# Patient Record
Sex: Female | Born: 1965 | Race: White | Hispanic: No | Marital: Married | Smoking: Former smoker
Health system: Southern US, Community
[De-identification: ages and names within clinical notes are randomized; demographics above are authoritative.]

## PROBLEM LIST (undated history)

## (undated) DIAGNOSIS — E739 Lactose intolerance, unspecified: Secondary | ICD-10-CM

## (undated) DIAGNOSIS — D249 Benign neoplasm of unspecified breast: Secondary | ICD-10-CM

## (undated) DIAGNOSIS — E041 Nontoxic single thyroid nodule: Secondary | ICD-10-CM

## (undated) DIAGNOSIS — F419 Anxiety disorder, unspecified: Secondary | ICD-10-CM

## (undated) DIAGNOSIS — E7800 Pure hypercholesterolemia, unspecified: Secondary | ICD-10-CM

## (undated) DIAGNOSIS — T7840XA Allergy, unspecified, initial encounter: Secondary | ICD-10-CM

## (undated) DIAGNOSIS — K760 Fatty (change of) liver, not elsewhere classified: Secondary | ICD-10-CM

## (undated) DIAGNOSIS — D649 Anemia, unspecified: Secondary | ICD-10-CM

## (undated) DIAGNOSIS — E785 Hyperlipidemia, unspecified: Secondary | ICD-10-CM

## (undated) DIAGNOSIS — K589 Irritable bowel syndrome without diarrhea: Secondary | ICD-10-CM

## (undated) HISTORY — DX: Nontoxic single thyroid nodule: E04.1

## (undated) HISTORY — DX: Irritable bowel syndrome, unspecified: K58.9

## (undated) HISTORY — DX: Fatty (change of) liver, not elsewhere classified: K76.0

## (undated) HISTORY — DX: Anemia, unspecified: D64.9

## (undated) HISTORY — DX: Allergy, unspecified, initial encounter: T78.40XA

## (undated) HISTORY — DX: Hyperlipidemia, unspecified: E78.5

## (undated) HISTORY — DX: Lactose intolerance, unspecified: E73.9

## (undated) HISTORY — DX: Benign neoplasm of unspecified breast: D24.9

## (undated) HISTORY — DX: Pure hypercholesterolemia, unspecified: E78.00

## (undated) HISTORY — DX: Anxiety disorder, unspecified: F41.9

---

## 2001-08-10 ENCOUNTER — Ambulatory Visit (HOSPITAL_COMMUNITY): Admission: RE | Admit: 2001-08-10 | Discharge: 2001-08-10 | Payer: Self-pay | Admitting: *Deleted

## 2001-08-10 ENCOUNTER — Encounter: Payer: Self-pay | Admitting: *Deleted

## 2002-10-17 ENCOUNTER — Other Ambulatory Visit: Admission: RE | Admit: 2002-10-17 | Discharge: 2002-10-17 | Payer: Self-pay | Admitting: *Deleted

## 2002-10-21 ENCOUNTER — Encounter: Admission: RE | Admit: 2002-10-21 | Discharge: 2002-10-21 | Payer: Self-pay | Admitting: *Deleted

## 2002-10-21 ENCOUNTER — Encounter: Payer: Self-pay | Admitting: *Deleted

## 2003-02-17 ENCOUNTER — Ambulatory Visit (HOSPITAL_COMMUNITY): Admission: RE | Admit: 2003-02-17 | Discharge: 2003-02-17 | Payer: Self-pay | Admitting: Family Medicine

## 2003-03-11 ENCOUNTER — Emergency Department (HOSPITAL_COMMUNITY): Admission: EM | Admit: 2003-03-11 | Discharge: 2003-03-11 | Payer: Self-pay | Admitting: Emergency Medicine

## 2003-06-03 ENCOUNTER — Encounter: Admission: RE | Admit: 2003-06-03 | Discharge: 2003-06-03 | Payer: Self-pay | Admitting: *Deleted

## 2003-10-22 ENCOUNTER — Encounter: Admission: RE | Admit: 2003-10-22 | Discharge: 2003-10-22 | Payer: Self-pay | Admitting: *Deleted

## 2003-12-04 ENCOUNTER — Other Ambulatory Visit: Admission: RE | Admit: 2003-12-04 | Discharge: 2003-12-04 | Payer: Self-pay | Admitting: *Deleted

## 2004-11-04 ENCOUNTER — Encounter: Admission: RE | Admit: 2004-11-04 | Discharge: 2004-11-04 | Payer: Self-pay | Admitting: *Deleted

## 2005-01-31 ENCOUNTER — Other Ambulatory Visit: Admission: RE | Admit: 2005-01-31 | Discharge: 2005-01-31 | Payer: Self-pay | Admitting: Obstetrics & Gynecology

## 2005-11-28 ENCOUNTER — Encounter: Admission: RE | Admit: 2005-11-28 | Discharge: 2005-11-28 | Payer: Self-pay | Admitting: Obstetrics and Gynecology

## 2006-04-17 ENCOUNTER — Other Ambulatory Visit: Admission: RE | Admit: 2006-04-17 | Discharge: 2006-04-17 | Payer: Self-pay | Admitting: Obstetrics and Gynecology

## 2007-01-31 ENCOUNTER — Encounter: Admission: RE | Admit: 2007-01-31 | Discharge: 2007-01-31 | Payer: Self-pay | Admitting: Obstetrics and Gynecology

## 2007-04-25 ENCOUNTER — Other Ambulatory Visit: Admission: RE | Admit: 2007-04-25 | Discharge: 2007-04-25 | Payer: Self-pay | Admitting: Obstetrics & Gynecology

## 2008-04-14 ENCOUNTER — Encounter: Admission: RE | Admit: 2008-04-14 | Discharge: 2008-04-14 | Payer: Self-pay | Admitting: Gynecology

## 2008-04-28 ENCOUNTER — Other Ambulatory Visit: Admission: RE | Admit: 2008-04-28 | Discharge: 2008-04-28 | Payer: Self-pay | Admitting: Gynecology

## 2008-04-28 ENCOUNTER — Encounter: Payer: Self-pay | Admitting: Gynecology

## 2008-04-28 ENCOUNTER — Ambulatory Visit: Payer: Self-pay | Admitting: Gynecology

## 2008-11-14 ENCOUNTER — Encounter: Payer: Self-pay | Admitting: Gynecology

## 2008-11-14 ENCOUNTER — Ambulatory Visit: Payer: Self-pay | Admitting: Gynecology

## 2008-12-12 ENCOUNTER — Other Ambulatory Visit: Admission: RE | Admit: 2008-12-12 | Discharge: 2008-12-12 | Payer: Self-pay | Admitting: Gynecology

## 2008-12-12 ENCOUNTER — Ambulatory Visit: Payer: Self-pay | Admitting: Gynecology

## 2009-01-03 HISTORY — PX: BREAST BIOPSY: SHX20

## 2009-05-03 HISTORY — PX: BREAST FIBROADENOMA SURGERY: SHX580

## 2009-05-15 ENCOUNTER — Ambulatory Visit: Payer: Self-pay | Admitting: Gynecology

## 2009-05-15 ENCOUNTER — Other Ambulatory Visit: Admission: RE | Admit: 2009-05-15 | Discharge: 2009-05-15 | Payer: Self-pay | Admitting: Gynecology

## 2009-05-20 ENCOUNTER — Encounter: Admission: RE | Admit: 2009-05-20 | Discharge: 2009-05-20 | Payer: Self-pay | Admitting: Gynecology

## 2009-06-05 ENCOUNTER — Ambulatory Visit: Payer: Self-pay | Admitting: Gynecology

## 2009-11-04 ENCOUNTER — Ambulatory Visit: Payer: Self-pay | Admitting: Gynecology

## 2010-01-28 ENCOUNTER — Ambulatory Visit
Admission: RE | Admit: 2010-01-28 | Discharge: 2010-01-28 | Payer: Self-pay | Source: Home / Self Care | Attending: Gynecology | Admitting: Gynecology

## 2010-01-29 ENCOUNTER — Other Ambulatory Visit: Payer: Self-pay | Admitting: Gynecology

## 2010-01-29 DIAGNOSIS — N6310 Unspecified lump in the right breast, unspecified quadrant: Secondary | ICD-10-CM

## 2010-02-05 ENCOUNTER — Ambulatory Visit
Admission: RE | Admit: 2010-02-05 | Discharge: 2010-02-05 | Disposition: A | Discharge status: Home / Self Care | Payer: 59 | Source: Ambulatory Visit | Attending: Gynecology | Admitting: Gynecology

## 2010-02-05 DIAGNOSIS — N6310 Unspecified lump in the right breast, unspecified quadrant: Secondary | ICD-10-CM

## 2010-05-12 ENCOUNTER — Other Ambulatory Visit: Payer: Self-pay | Admitting: Gynecology

## 2010-05-12 DIAGNOSIS — Z1231 Encounter for screening mammogram for malignant neoplasm of breast: Secondary | ICD-10-CM

## 2010-05-25 ENCOUNTER — Ambulatory Visit
Admission: RE | Admit: 2010-05-25 | Discharge: 2010-05-25 | Disposition: A | Discharge status: Home / Self Care | Payer: 59 | Source: Ambulatory Visit | Attending: Gynecology | Admitting: Gynecology

## 2010-05-25 DIAGNOSIS — Z1231 Encounter for screening mammogram for malignant neoplasm of breast: Secondary | ICD-10-CM

## 2010-05-28 ENCOUNTER — Encounter: Payer: 59 | Admitting: Gynecology

## 2010-06-08 ENCOUNTER — Other Ambulatory Visit: Payer: Self-pay | Admitting: Gastroenterology

## 2010-06-23 ENCOUNTER — Encounter: Payer: 59 | Admitting: Gynecology

## 2010-07-06 ENCOUNTER — Encounter: Payer: 59 | Admitting: Gynecology

## 2010-07-20 ENCOUNTER — Other Ambulatory Visit: Payer: Self-pay | Admitting: Gynecology

## 2010-07-20 ENCOUNTER — Other Ambulatory Visit (HOSPITAL_COMMUNITY)
Admission: RE | Admit: 2010-07-20 | Discharge: 2010-07-20 | Disposition: A | Discharge status: Home / Self Care | Payer: 59 | Source: Ambulatory Visit | Attending: Gynecology | Admitting: Gynecology

## 2010-07-20 ENCOUNTER — Encounter (INDEPENDENT_AMBULATORY_CARE_PROVIDER_SITE_OTHER): Payer: 59 | Admitting: Gynecology

## 2010-07-20 DIAGNOSIS — Z01419 Encounter for gynecological examination (general) (routine) without abnormal findings: Secondary | ICD-10-CM

## 2010-07-20 DIAGNOSIS — Z124 Encounter for screening for malignant neoplasm of cervix: Secondary | ICD-10-CM | POA: Insufficient documentation

## 2010-11-30 ENCOUNTER — Encounter: Payer: Self-pay | Admitting: Gynecology

## 2010-11-30 ENCOUNTER — Ambulatory Visit (INDEPENDENT_AMBULATORY_CARE_PROVIDER_SITE_OTHER): Payer: 59 | Admitting: Gynecology

## 2010-11-30 VITALS — BP 110/70

## 2010-11-30 DIAGNOSIS — B3731 Acute candidiasis of vulva and vagina: Secondary | ICD-10-CM

## 2010-11-30 DIAGNOSIS — B373 Candidiasis of vulva and vagina: Secondary | ICD-10-CM

## 2010-11-30 DIAGNOSIS — K589 Irritable bowel syndrome without diarrhea: Secondary | ICD-10-CM | POA: Insufficient documentation

## 2010-11-30 DIAGNOSIS — L293 Anogenital pruritus, unspecified: Secondary | ICD-10-CM

## 2010-11-30 DIAGNOSIS — N898 Other specified noninflammatory disorders of vagina: Secondary | ICD-10-CM

## 2010-11-30 DIAGNOSIS — D249 Benign neoplasm of unspecified breast: Secondary | ICD-10-CM | POA: Insufficient documentation

## 2010-11-30 MED ORDER — FLUCONAZOLE 200 MG PO TABS: 200.0000 mg | ORAL_TABLET | Freq: Every day | ORAL | Status: AC

## 2010-11-30 NOTE — Progress Notes (Signed)
Patient presents complaining of a two-week history of vulvar pruritus and itching. She treated herself with OTC antifungal seem to transiently get better but then recur she got a second dose and has used it for several days and her symptoms seem to be getting worse.  Exam External with intense generalized vulvitis primarily labia majora bilaterally no specific lesions. Vagina with slight white discharge KOH wet prep done bimanual without masses or tenderness  Assessment and plan: wet prep is positive for yeast. We'll treat with Diflucan 200 mg daily for 5 days to eradicate cutaneous involvement. Patient will follow up if symptoms persist or recur

## 2010-11-30 NOTE — Patient Instructions (Signed)
Follow up if symptoms persist or recur.

## 2010-12-14 ENCOUNTER — Encounter: Payer: Self-pay | Admitting: Gynecology

## 2010-12-14 ENCOUNTER — Ambulatory Visit (INDEPENDENT_AMBULATORY_CARE_PROVIDER_SITE_OTHER): Payer: 59 | Admitting: Gynecology

## 2010-12-14 DIAGNOSIS — N899 Noninflammatory disorder of vagina, unspecified: Secondary | ICD-10-CM

## 2010-12-14 DIAGNOSIS — B373 Candidiasis of vulva and vagina: Secondary | ICD-10-CM

## 2010-12-14 DIAGNOSIS — N898 Other specified noninflammatory disorders of vagina: Secondary | ICD-10-CM

## 2010-12-14 DIAGNOSIS — B3731 Acute candidiasis of vulva and vagina: Secondary | ICD-10-CM

## 2010-12-14 DIAGNOSIS — R350 Frequency of micturition: Secondary | ICD-10-CM

## 2010-12-14 MED ORDER — FLUCONAZOLE 200 MG PO TABS: 200.0000 mg | ORAL_TABLET | Freq: Every day | ORAL | Status: AC

## 2010-12-14 MED ORDER — TERCONAZOLE 0.8 % VA CREA: 1.0000 | TOPICAL_CREAM | Freq: Every day | VAGINAL | Status: AC

## 2010-12-14 NOTE — Progress Notes (Signed)
Patient presents after having recently been treated for a yeast vulvovaginitis said seems to be better but still persistent itching and irritation.  Exam with chaperone present External with a mild generalized Folvite is no specific lesions. Vaginal exam shows white discharge cervix normal uterus normal size midline mobile nontender adnexa without masses or tenderness  Assessment and plan: Wet prep is positive for yeast. We'll treat with Terazol 3 cream internal/external and Diflucan 200 daily x7 days follow up if symptoms persist or recur. She recently had blood work to her primary have asked her to check to make sure they look of her white count and glucose.

## 2010-12-14 NOTE — Patient Instructions (Signed)
Use Diflucan pills daily x1 week and Terazol cream external and internal times several days.

## 2011-09-16 ENCOUNTER — Other Ambulatory Visit: Payer: Self-pay | Admitting: Gynecology

## 2011-09-16 DIAGNOSIS — Z1231 Encounter for screening mammogram for malignant neoplasm of breast: Secondary | ICD-10-CM

## 2011-09-23 ENCOUNTER — Ambulatory Visit (INDEPENDENT_AMBULATORY_CARE_PROVIDER_SITE_OTHER): Payer: 59 | Admitting: Women's Health

## 2011-09-23 ENCOUNTER — Encounter: Payer: Self-pay | Admitting: Women's Health

## 2011-09-23 DIAGNOSIS — N644 Mastodynia: Secondary | ICD-10-CM

## 2011-09-23 NOTE — Progress Notes (Signed)
Patient ID: Michele Manning, female   DOB: 1965/03/31, 46 y.o.   MRN: [id number] Presents with the complaint of discomfort/pain right inner breast for 4-5 days. States feels like a bruise without injury. History of fibroadenoma 1990. Right breast biopsy 2011 benign fibroadenoma. Denies wearing underwire bra. Normal mammogram 05/2010, annual mammogram scheduled in 11-02-2022. Maternal grandmother died at age 26 breast cancer. Minimal caffeine consumption.  Exam: No palpable nodules and sitting or lying position, no visible dimpling, ecchymosis, retractions or nipple discharge.  New onset mastodynia right breast inner aspect History of a fibroadenomas  Plan: Diagnostic right breast mammogram. Will schedule. 3-D screening mammogram reviewed, encouraged to have done at annual screen.

## 2011-09-26 ENCOUNTER — Telehealth: Payer: Self-pay | Admitting: *Deleted

## 2011-09-26 DIAGNOSIS — N644 Mastodynia: Secondary | ICD-10-CM

## 2011-09-26 NOTE — Telephone Encounter (Signed)
Message copied by Aura Camps on Mon Sep 26, 2011  1:18 PM ------      Message from: Ronald, [place] J      Created: Fri Sep 23, 2011  2:24 PM       Please schedule rt breast diagnostic mammogram. Pain in inner aspect for 5 d, history of fibroadenoma.  MGM breast cancer -  Died at 62. No palpable masses

## 2011-09-26 NOTE — Telephone Encounter (Signed)
Order placed for bil. Mammogram.

## 2011-09-27 NOTE — Telephone Encounter (Signed)
10/07/11 @ 10:20 am appointment.

## 2011-10-07 ENCOUNTER — Ambulatory Visit
Admission: RE | Admit: 2011-10-07 | Discharge: 2011-10-07 | Disposition: A | Discharge status: Home / Self Care | Payer: 59 | Source: Ambulatory Visit | Attending: Women's Health | Admitting: Women's Health

## 2011-10-07 DIAGNOSIS — N644 Mastodynia: Secondary | ICD-10-CM

## 2011-10-14 ENCOUNTER — Ambulatory Visit (INDEPENDENT_AMBULATORY_CARE_PROVIDER_SITE_OTHER): Payer: 59 | Admitting: Gynecology

## 2011-10-14 ENCOUNTER — Encounter: Payer: Self-pay | Admitting: Gynecology

## 2011-10-14 VITALS — BP 102/64 | Ht 66.75 in | Wt 161.0 lb

## 2011-10-14 DIAGNOSIS — Z131 Encounter for screening for diabetes mellitus: Secondary | ICD-10-CM

## 2011-10-14 DIAGNOSIS — Z13220 Encounter for screening for lipoid disorders: Secondary | ICD-10-CM

## 2011-10-14 DIAGNOSIS — Z01419 Encounter for gynecological examination (general) (routine) without abnormal findings: Secondary | ICD-10-CM

## 2011-10-14 LAB — CBC WITH DIFFERENTIAL/PLATELET
Hemoglobin: 13.8 g/dL (ref 12.0–15.0)
Lymphs Abs: 1.9 10*3/uL (ref 0.7–4.0)
Monocytes Relative: 7 % (ref 3–12)
Neutro Abs: 3.8 10*3/uL (ref 1.7–7.7)
Neutrophils Relative %: 60 % (ref 43–77)
RBC: 4.75 MIL/uL (ref 3.87–5.11)
WBC: 6.3 10*3/uL (ref 4.0–10.5)

## 2011-10-14 NOTE — Progress Notes (Signed)
Michele Manning [birth year] [id number]        46 y.o.  G3P3 for annual exam.  Doing well.  Past medical history,surgical history, medications, allergies, family history and social history were all reviewed and documented in the EPIC chart. ROS:  Was performed and pertinent positives and negatives are included in the history.  Exam: Michele Manning assistant Filed Vitals:   10/14/11 1125  BP: 102/64  Height: 5' 6.75" (1.695 m)  Weight: 161 lb (73.029 kg)   General appearance  Normal Skin grossly normal Head/Neck normal with no cervical or supraclavicular adenopathy thyroid normal Lungs  clear Cardiac RR, without RMG Abdominal  soft, nontender, without masses, organomegaly or hernia Breasts  examined lying and sitting without masses, retractions, discharge or axillary adenopathy. Pelvic  Ext/BUS/vagina  normal   Cervix  normal   Uterus  axial, normal size, shape and contour, midline and mobile nontender   Adnexa  Without masses or tenderness    Anus and perineum  normal   Rectovaginal  normal sphincter tone without palpated masses or tenderness.    Assessment/Plan:  46 y.o. G3P3 female for annual exam, regular menses, vasectomy birth control.   1. Intermittent right lower sternal chest discomfort. Patient notes mild discomfort right peripheral breast/lower sternal border. Comes and goes uncomfortable when pushed on. Recently had mammogram which was normal. Exam today is normal with some tenderness at the right costochondral border. I reviewed costochondritis and recommended heat/nonsteroidal anti-inflammatory. Continue with self exam. As long as no palpable abnormalities in this area results will follow. If it would be more consistent worsening or change she'll represent for further evaluation. 2. Mammography. Patient just had will continue with annual mammography and SBE monthly. 3. Pap smear. No Pap smear done today. Last Pap smear 2012. Patient has no history of abnormal Pap smears before. We'll  plan every 3-5 your screening. 4. Health maintenance. Baseline CBC lipid profile glucose urinalysis ordered. Follow up one year, sooner as needed.    Michele Lords MD, 11:51 AM 10/14/2011

## 2011-10-14 NOTE — Patient Instructions (Addendum)
Follow up in one year for annual exam. If right chest wall/breast discomfort becomes consistent, worsens or changes represent for further evaluation. Continue with annual mammography.

## 2011-10-15 LAB — URINALYSIS W MICROSCOPIC + REFLEX CULTURE
Bilirubin Urine: NEGATIVE
Casts: NONE SEEN
Glucose, UA: NEGATIVE mg/dL
Hgb urine dipstick: NEGATIVE
Protein, ur: NEGATIVE mg/dL
pH: 7 (ref 5.0–8.0)

## 2011-10-15 LAB — LIPID PANEL
Cholesterol: 211 mg/dL — ABNORMAL HIGH (ref 0–200)
HDL: 54 mg/dL (ref >39)
Total CHOL/HDL Ratio: 3.9 Ratio

## 2011-10-15 LAB — GLUCOSE, RANDOM: Glucose, Bld: 87 mg/dL (ref 70–99)

## 2011-10-16 LAB — URINE CULTURE: Colony Count: 55000

## 2011-10-21 ENCOUNTER — Ambulatory Visit: Payer: 59

## 2012-02-22 ENCOUNTER — Encounter: Payer: Self-pay | Admitting: Women's Health

## 2012-04-24 ENCOUNTER — Encounter: Payer: Self-pay | Admitting: Gynecology

## 2012-04-24 ENCOUNTER — Ambulatory Visit (INDEPENDENT_AMBULATORY_CARE_PROVIDER_SITE_OTHER): Payer: 59 | Admitting: Gynecology

## 2012-04-24 DIAGNOSIS — R1032 Left lower quadrant pain: Secondary | ICD-10-CM

## 2012-04-24 NOTE — Progress Notes (Signed)
Patient presents with a two-year history of left lower quadrant discomfort that comes and goes. Frequently triggered by food. Has bouts of diarrhea and abdominal cramping. Was evaluated by gastroenterology 2 years ago with a negative colonoscopy. Saw her primary who feels she has spastic colon and has started to treat her for this. She did some reading and was worried about ovarian disease such as ovarian cancer and just wanted to make sure before undergoing a more intensive GI treatment that everything was okay from a GYN standpoint. Regular menses, vasectomy birth control.  Exam was Michele Manning Abdomen soft nontender without masses guarding rebound organomegaly. Active bowel sounds throughout.  Pelvic external BUS vagina normal. Cervix normal. Uterus normal size midline mobile nontender. Adnexa without masses or tenderness.  Rectovaginal exam is normal.  Assessment and plan: Long history of left lower quadrant pain consistent with irritable bowel/spastic colon. Had colonoscopy 2 years ago historically. Recommended baseline ultrasound to rule out ovarian/uterine issues for her reassurance. Otherwise recommend that she followup with gastroenterology for more aggressive management.

## 2012-04-24 NOTE — Patient Instructions (Signed)
Follow up for ultrasound as scheduled 

## 2012-05-07 ENCOUNTER — Encounter: Payer: Self-pay | Admitting: Gastroenterology

## 2012-05-11 ENCOUNTER — Other Ambulatory Visit: Payer: 59

## 2012-05-11 ENCOUNTER — Ambulatory Visit: Payer: 59 | Admitting: Gynecology

## 2012-05-18 ENCOUNTER — Ambulatory Visit (INDEPENDENT_AMBULATORY_CARE_PROVIDER_SITE_OTHER): Payer: 59

## 2012-05-18 ENCOUNTER — Ambulatory Visit (INDEPENDENT_AMBULATORY_CARE_PROVIDER_SITE_OTHER): Payer: 59 | Admitting: Gynecology

## 2012-05-18 ENCOUNTER — Encounter: Payer: Self-pay | Admitting: Gynecology

## 2012-05-18 DIAGNOSIS — R1032 Left lower quadrant pain: Secondary | ICD-10-CM

## 2012-05-18 NOTE — Progress Notes (Signed)
Patient presents for ultrasound due to history of chronic left lower quadrant pain.  Ultrasound is normal noting uterus normal size and echotexture. Endometrial echo 6.7 mm. Right left ovary is visualized and normal. Cul-de-sac negative.  Assessment and plan: Chronic abdominal pain history of irritable bowel. Patient has appointment to see gastroenterology next week and will followup for this.

## 2012-05-18 NOTE — Patient Instructions (Signed)
Followup with gastroenterologist as you have scheduled.

## 2012-05-24 ENCOUNTER — Encounter: Payer: Self-pay | Admitting: Gastroenterology

## 2012-05-24 ENCOUNTER — Ambulatory Visit (INDEPENDENT_AMBULATORY_CARE_PROVIDER_SITE_OTHER): Payer: 59 | Admitting: Gastroenterology

## 2012-05-24 VITALS — BP 100/68 | HR 84 | Ht 66.75 in | Wt 161.6 lb

## 2012-05-24 DIAGNOSIS — R1032 Left lower quadrant pain: Secondary | ICD-10-CM

## 2012-05-24 DIAGNOSIS — R197 Diarrhea, unspecified: Secondary | ICD-10-CM

## 2012-05-24 DIAGNOSIS — G8929 Other chronic pain: Secondary | ICD-10-CM

## 2012-05-24 MED ORDER — HYOSCYAMINE SULFATE 0.125 MG SL SUBL: 0.1250 mg | SUBLINGUAL_TABLET | SUBLINGUAL | Status: DC | PRN

## 2012-05-24 NOTE — Progress Notes (Signed)
History of Present Illness:  This is a very pleasant 47 year old Caucasian female with 4 years of intermittent crampy left lower quadrant pain, loose diarrhea-type stools but no rectal bleeding.  In 2012 she had colonoscopy by Dr. Evette Cristal at [facility] which was apparently normal although I do not have that report.  Evaluation also was negative for celiac disease.  Recent labs are reviewed and all normal.  She continues with crampy left lower quadrant pain made better by bowel movement, and denies incontinency, nocturnal diarrhea, or any particular urgency.  She does have lactose intolerance, and avoids major dairy products.  The patient is tried probiotics, well Her meds, and they verified FOD-MAP some success, but continues to chew diet gum and breath meds.  Her appetite remains good and she has had no weight loss after an initial 20 pound weight loss in 2012.  There is no rectal bleeding, upper GI or hepatobiliary complaints.  Gynecologic evaluation by Dr. Audie Box has included normal ultrasound and pelvic exam.  The patient's diarrhea seems to be made worse with roughage, and is better with loperamide but this causes some resultant constipation.  She does have mild gas and bloating, this is not severe.  There is no associated skin rash, joint pains, mouth sores, or other systemic complaints.  The patient denies severe anxiety, depression, or other neuropsychiatric problems.  I have reviewed this patient's present history, medical and surgical past history, allergies and medications.     ROS:   All systems were reviewed and are negative unless otherwise stated in the HPI... history very cold hands but no true Raynaud's phenomenon.  No other symptoms of collagen vascular disease, cardiovascular pulmonary complaints.  No Known Allergies Outpatient Prescriptions Prior to Visit  Medication Sig Dispense Refill  . ALPRAZolam (XANAX) 0.25 MG tablet Take 0.25 mg by mouth at bedtime as needed for sleep.      .  Azelastine HCl (ASTEPRO NA) Place into the nose.        . fexofenadine-pseudoephedrine (ALLEGRA-D) 60-120 MG per tablet Take 1 tablet by mouth daily.       . Probiotic Product (PROBIOTIC PO) Take by mouth.       No facility-administered medications prior to visit.   Past Medical History  Diagnosis Date  . 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 IBS (irritable bowel syndrome)     LACTOSE INTOLERANT  . Lactose intolerance   . Hyperlipemia   . Anxiety    Past Surgical History  Procedure Laterality Date  . Breast fibroadenoma surgery  05/2009    FIBROADENOMA RIGHT BREAST   History   Social History  . Marital Status: Married    Spouse Name: N/A    Number of Children: 3  . 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 Smoking status: Never Smoker   . Smokeless tobacco: Never Used  . Alcohol Use: Yes     Comment: glass wine every day  . Drug Use: No  . Sexually Active: Yes -- Female partner(s)    Birth Control/ Protection: Other-see comments     Comment: vasectomy-husband   Other Topics Concern  . None   Social History Narrative  . None   Family History  Problem Relation Age of Onset  . Breast cancer Maternal Grandmother     early fifties  . COPD Father        Physical Exam:  At pressure 100/68, pulse 84 and regular and weight 161 with a BMI of 25.51. General well developed well nourished patient in no acute distress, appearing their stated age Eyes PERRLA, no icterus, fundoscopic exam per opthamologist Skin no lesions noted Neck supple, no adenopathy, no thyroid enlargement, no tenderness Chest clear to percussion and auscultation Heart no significant murmurs, gallops or rubs noted Abdomen no hepatosplenomegaly masses or tenderness, BS normal.  There is mild tenderness to deep palpation the left lower quadrant over a mobile sigmoid colon. Rectal inspection normal no fissures, or  fistulae noted.  No masses or tenderness on digital exam. Stool guaiac negative. Extremities no acute joint lesions, edema, phlebitis or evidence of cellulitis. Neurologic patient oriented x 3, cranial nerves intact, no focal neurologic deficits noted. Psychological mental status normal and normal affect.  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  I placed her on a low fructose, sorbitol diet, and of course she already avoids lactose products.  I will let her try when necessary sublingual Levsin 0.125 mg for left lower quadrant spasms.  Her insurance company has refused followup colonoscopy exam.  She may be a good candidate for Lotronex therapy depending on her clinical response to dietary adjustments and the above medications.  It is certainly possible this patient has underlying inflammatory bowel disease, and we may need to go ahead with repeat colonoscopy despite her insurance company's resistance.  We'll see her in several weeks' time for followup visit.  She has been given information concerning IBS and is management.  Please send a copy this to Dr. Audie Box at her primary care physician Dr. Mila Palmer.  No diagnosis found.

## 2012-05-24 NOTE — Patient Instructions (Addendum)
  We have sent the following medications to your pharmacy for you to pick up at your convenience: Levsin, please take as directed.  Information on Irritable Bowel Syndrome given today.  Information on Artifical Sweeteners given today.  Please make a follow up visit in 3 weeks with Dr. Jarold Motto. __________________________________________________________________________________________________________                                               We are excited to introduce MyChart, a new best-in-class service that provides you online access to important information in your electronic medical record. We want to make it easier for you to view your health information - all in one secure location - when and where you need it. We expect MyChart will enhance the quality of care and service we provide.  When you register for MyChart, you can:    View your test results.    Request appointments and receive appointment reminders via email.    Request medication renewals.    View your medical history, allergies, medications and immunizations.    Communicate with your physician's office through a password-protected site.    Conveniently print information such as your medication lists.  To find out if MyChart is right for you, please talk to a member of our clinical staff today. We will gladly answer your questions about this free health and wellness tool.  If you are age 45 or older and want a member of your family to have access to your record, you must provide written consent by completing a proxy form available at our office. Please speak to our clinical staff about guidelines regarding accounts for patients younger than age 45.  As you activate your MyChart account and need any technical assistance, please call the MyChart technical support line at [phone]-CHART 5090800523) or email your question to [email or web address]. If you email your question(s), please include your name, a  return phone number and the best time to reach you.  If you have non-urgent health-related questions, you can send a message to our office through MyChart at [email or web address]. If you have a medical emergency, call 911.  Thank you for using MyChart as your new health and wellness resource!   MyChart licensed from [employer],  [id number]. Patents Pending.

## 2012-05-24 NOTE — Addendum Note (Signed)
Addended by: Ok Anis A on: 05/24/2012 10:37 AM   Modules accepted: Orders

## 2012-05-30 ENCOUNTER — Encounter: Payer: Self-pay | Admitting: Family Medicine

## 2012-06-07 ENCOUNTER — Encounter: Payer: Self-pay | Admitting: Family Medicine

## 2012-06-15 ENCOUNTER — Ambulatory Visit (INDEPENDENT_AMBULATORY_CARE_PROVIDER_SITE_OTHER): Payer: 59 | Admitting: Gastroenterology

## 2012-06-15 ENCOUNTER — Encounter: Payer: Self-pay | Admitting: Gastroenterology

## 2012-06-15 VITALS — BP 98/60 | HR 100 | Ht 66.0 in | Wt 161.4 lb

## 2012-06-15 DIAGNOSIS — K589 Irritable bowel syndrome without diarrhea: Secondary | ICD-10-CM

## 2012-06-15 NOTE — Progress Notes (Addendum)
History of Present Illness: This is a 47 year old Caucasian female who has what appears to be IBS diarrhea.  She's better with dietary adjustments and when necessary Levsin use.  She denies  melena, hematochezia, anorexia, weight loss, systemic complaints et Karie Soda.  She's accompanied by her husband during the interview today.  I received her colonoscopy report from Dr. Evette Cristal, and she did have previous negative colonoscopy with multiple colon biopsy showed no evidence of microscopic colitis.  This was accomplished in June of 2012.     Current Medications, Allergies, Past Medical History, Past Surgical History, Family History and Social History were reviewed in [employer] record.  ROS: All systems were reviewed and are negative unless otherwise stated in the HPI.         Assessment and plan: IBS-type diarrhea.. have asked her to avoid nonabsorbable carbohydrates, and continues when necessary Levsin, and I do not think repeat colonoscopy indicated.  I will see her back again in 2 weeks' time for followup.  If her problems continue the future we need to consider Lotronex therapy.

## 2012-06-15 NOTE — Patient Instructions (Addendum)
Information on Fiber and Supplements given today for your review. Information on Artifical Sweeteners given today. Please start benefiber one tablespoon once daily. Please follow up with Dr.Patterson in two weeks. ____________________________________________________  High-Fiber Diet Fiber is found in fruits, vegetables, and grains. A high-fiber diet encourages the addition of more whole grains, legumes, fruits, and vegetables in your diet. The recommended amount of fiber for adult males is 38 g per day. For adult females, it is 25 g per day. Pregnant and lactating women should get 28 g of fiber per day. If you have a digestive or bowel problem, ask your caregiver for advice before adding high-fiber foods to your diet. Eat a variety of high-fiber foods instead of only a select few type of foods.  PURPOSE  To increase stool bulk.  To make bowel movements more regular to prevent constipation.  To lower cholesterol.  To prevent overeating. WHEN IS THIS DIET USED?  It may be used if you have constipation and hemorrhoids.  It may be used if you have uncomplicated diverticulosis (intestine condition) and irritable bowel syndrome.  It may be used if you need help with weight management.  It may be used if you want to add it to your diet as a protective measure against atherosclerosis, diabetes, and cancer. SOURCES OF FIBER  Whole-grain breads and cereals.  Fruits, such as apples, oranges, bananas, berries, prunes, and pears.  Vegetables, such as green peas, carrots, sweet potatoes, beets, broccoli, cabbage, spinach, and artichokes.  Legumes, such split peas, soy, lentils.  Almonds. FIBER CONTENT IN FOODS Starches and Grains / Dietary Fiber (g)  Cheerios, 1 cup / 3 g  Corn Flakes cereal, 1 cup / 0.7 g  Rice crispy treat cereal, 1 cup / 0.3 g  Instant oatmeal (cooked),  cup / 2 g  Frosted wheat cereal, 1 cup / 5.1 g  Brown, long-grain rice (cooked), 1 cup / 3.5 g  White,  long-grain rice (cooked), 1 cup / 0.6 g  Enriched macaroni (cooked), 1 cup / 2.5 g Legumes / Dietary Fiber (g)  Baked beans (canned, plain, or vegetarian),  cup / 5.2 g  Kidney beans (canned),  cup / 6.8 g  Pinto beans (cooked),  cup / 5.5 g Breads and Crackers / Dietary Fiber (g)  Plain or honey graham crackers, 2 squares / 0.7 g  Saltine crackers, 3 squares / 0.3 g  Plain, salted pretzels, 10 pieces / 1.8 g  Whole-wheat bread, 1 slice / 1.9 g  White bread, 1 slice / 0.7 g  Raisin bread, 1 slice / 1.2 g  Plain bagel, 3 oz / 2 g  Flour tortilla, 1 oz / 0.9 g  Corn tortilla, 1 small / 1.5 g  Hamburger or hotdog bun, 1 small / 0.9 g Fruits / Dietary Fiber (g)  Apple with skin, 1 medium / 4.4 g  Sweetened applesauce,  cup / 1.5 g  Banana,  medium / 1.5 g  Grapes, 10 grapes / 0.4 g  Orange, 1 small / 2.3 g  Raisin, 1.5 oz / 1.6 g  Melon, 1 cup / 1.4 g Vegetables / Dietary Fiber (g)  Green beans (canned),  cup / 1.3 g  Carrots (cooked),  cup / 2.3 g  Broccoli (cooked),  cup / 2.8 g  Peas (cooked),  cup / 4.4 g  Mashed potatoes,  cup / 1.6 g  Lettuce, 1 cup / 0.5 g  Corn (canned),  cup / 1.6 g  Tomato,  cup /  1.1 g Document Released: 12/20/2004 Document Revised: 06/21/2011 Document Reviewed: 03/24/2011 [facility] Patient Information 2014 [place], [place].                                                   We are excited to introduce MyChart, a new best-in-class service that provides you online access to important information in your electronic medical record. We want to make it easier for you to view your health information - all in one secure location - when and where you need it. We expect MyChart will enhance the quality of care and service we provide.  When you register for MyChart, you can:    View your test results.    Request appointments and receive appointment reminders via email.    Request medication renewals.    View your  medical history, allergies, medications and immunizations.    Communicate with your physician's office through a password-protected site.    Conveniently print information such as your medication lists.  To find out if MyChart is right for you, please talk to a member of our clinical staff today. We will gladly answer your questions about this free health and wellness tool.  If you are age 13 or older and want a member of your family to have access to your record, you must provide written consent by completing a proxy form available at our office. Please speak to our clinical staff about guidelines regarding accounts for patients younger than age 1.  As you activate your MyChart account and need any technical assistance, please call the MyChart technical support line at ([phone]-CHART [phone]) or email your question to [email or web address]. If you email your question(s), please include your name, a return phone number and the best time to reach you.  If you have non-urgent health-related questions, you can send a message to our office through MyChart at [email or web address]. If you have a medical emergency, call 911.  Thank you for using MyChart as your new health and wellness resource!   MyChart licensed from [employer],  [id number]. Patents Pending.

## 2012-06-25 ENCOUNTER — Encounter: Payer: Self-pay | Admitting: Gastroenterology

## 2012-07-03 ENCOUNTER — Encounter: Payer: Self-pay | Admitting: Gastroenterology

## 2012-07-03 ENCOUNTER — Ambulatory Visit (INDEPENDENT_AMBULATORY_CARE_PROVIDER_SITE_OTHER): Payer: 59 | Admitting: Gastroenterology

## 2012-07-03 VITALS — BP 106/72 | HR 80 | Ht 66.14 in | Wt 161.1 lb

## 2012-07-03 DIAGNOSIS — R197 Diarrhea, unspecified: Secondary | ICD-10-CM

## 2012-07-03 DIAGNOSIS — K589 Irritable bowel syndrome without diarrhea: Secondary | ICD-10-CM

## 2012-07-03 NOTE — Patient Instructions (Addendum)
Please call in two weeks and ask for Dr. Norval Gable nurse Aram Beecham to give an update on how you are feeling.  When you decide to try Lotronex just let us know.

## 2012-07-03 NOTE — Progress Notes (Signed)
History of Present Illness: This is a 47 year old Caucasian female with 2-3 years of intermittent rather severe urgent diarrhea.  She's tried elimination diet and is doing much better symptomatically but continues with lactose intolerance and occasional urgent diarrhea without melena or hematochezia.  Review of her chart shows recent negative upper abdominal ultrasound exam.  She's had some improvement with Levsin use.  Again, colonoscopy 2012 was unremarkable.  Patient relates that she took probable amoxicillin 2 weeks ago for a strep throat and had improvement in her GI complaints.    Current Medications, Allergies, Past Medical History, Past Surgical History, Family History and Social History were reviewed in [employer] record.  ROS: All systems were reviewed and are negative unless otherwise stated in the HPI.         Assessment and plan: Diarrhea predominant IBS.  I will let her try Lotronex 0.5 mg daily when necessary as needed while continuing her dietary restrictions.  I think it is unlikely that she has bacterial overgrowth syndrome, and she is on probiotic therapy already.  Reviewing information on Lotronex, the patient refused this medication and we will continue previous plans with her FOD-MAP diet for IBS with when necessary sublingual Levsin.  Please copy her primary care physician, referring physician, and pertinent subspecialists.

## 2012-09-07 ENCOUNTER — Other Ambulatory Visit: Payer: Self-pay | Admitting: Otolaryngology

## 2012-09-07 DIAGNOSIS — J3501 Chronic tonsillitis: Secondary | ICD-10-CM

## 2012-09-21 ENCOUNTER — Other Ambulatory Visit: Payer: Self-pay

## 2012-09-21 DIAGNOSIS — Z1231 Encounter for screening mammogram for malignant neoplasm of breast: Secondary | ICD-10-CM

## 2012-09-28 ENCOUNTER — Ambulatory Visit
Admission: RE | Admit: 2012-09-28 | Discharge: 2012-09-28 | Disposition: A | Discharge status: Home / Self Care | Payer: 59 | Source: Ambulatory Visit | Attending: Otolaryngology | Admitting: Otolaryngology

## 2012-09-28 DIAGNOSIS — J3501 Chronic tonsillitis: Secondary | ICD-10-CM

## 2012-09-28 MED ORDER — IOHEXOL 300 MG/ML  SOLN
75.0000 mL | Freq: Once | INTRAMUSCULAR | Status: AC | PRN
  Administered 2012-09-28: 75 mL via INTRAVENOUS

## 2012-10-19 ENCOUNTER — Encounter: Payer: Self-pay | Admitting: Gynecology

## 2012-10-19 ENCOUNTER — Telehealth: Payer: Self-pay | Admitting: *Deleted

## 2012-10-19 ENCOUNTER — Ambulatory Visit (INDEPENDENT_AMBULATORY_CARE_PROVIDER_SITE_OTHER): Payer: 59 | Admitting: Gynecology

## 2012-10-19 VITALS — BP 116/74 | Ht 67.0 in | Wt 163.0 lb

## 2012-10-19 DIAGNOSIS — Z01419 Encounter for gynecological examination (general) (routine) without abnormal findings: Secondary | ICD-10-CM

## 2012-10-19 DIAGNOSIS — Z13220 Encounter for screening for lipoid disorders: Secondary | ICD-10-CM

## 2012-10-19 DIAGNOSIS — N760 Acute vaginitis: Secondary | ICD-10-CM

## 2012-10-19 LAB — COMPREHENSIVE METABOLIC PANEL
ALT: 28 U/L (ref 0–35)
AST: 15 U/L (ref 0–37)
Chloride: 103 mEq/L (ref 96–112)
Creat: 0.87 mg/dL (ref 0.50–1.10)
Total Bilirubin: 0.5 mg/dL (ref 0.3–1.2)

## 2012-10-19 LAB — LIPID PANEL
Total CHOL/HDL Ratio: 3.4 Ratio
VLDL: 25 mg/dL (ref 0–40)

## 2012-10-19 LAB — CBC WITH DIFFERENTIAL/PLATELET
Basophils Absolute: 0 10*3/uL (ref 0.0–0.1)
Eosinophils Absolute: 0.2 10*3/uL (ref 0.0–0.7)
Eosinophils Relative: 2 % (ref 0–5)
Lymphocytes Relative: 24 % (ref 12–46)
MCV: 85.7 fL (ref 78.0–100.0)
Neutrophils Relative %: 66 % (ref 43–77)
Platelets: 261 10*3/uL (ref 150–400)
RDW: 14 % (ref 11.5–15.5)
WBC: 6.7 10*3/uL (ref 4.0–10.5)

## 2012-10-19 MED ORDER — NONFORMULARY OR COMPOUNDED ITEM: Status: DC

## 2012-10-19 NOTE — Progress Notes (Signed)
Michele Manning [birth year] [id number]        47 y.o.  G3P3 for annual exam.  Doing well.  Past medical history,surgical history, medications, allergies, family history and social history were all reviewed and documented in the EPIC chart.  ROS:  Performed and pertinent positives and negatives are included in the history, assessment and plan .  Exam: Kim assistant Filed Vitals:   10/19/12 1119  BP: 116/74  Height: 5\' 7"  (1.702 m)  Weight: 163 lb (73.936 kg)   General appearance  Normal Skin grossly normal Head/Neck normal with no cervical or supraclavicular adenopathy thyroid normal Lungs  clear Cardiac RR, without RMG Abdominal  soft, nontender, without masses, organomegaly or hernia Breasts  examined lying and sitting without masses, retractions, discharge or axillary adenopathy. Pelvic  Ext/BUS/vagina  normal  Cervix  normal   Uterus  anteverted, normal size, shape and contour, midline and mobile nontender   Adnexa  Without masses or tenderness    Anus and perineum  normal   Rectovaginal  normal sphincter tone without palpated masses or tenderness.    Assessment/Plan:  47 y.o. G3P3 female for annual exam, regular menses, vasectomy birth control.   1. Current yeast vaginitis. Patient does have an issue with recurrent yeast vaginitis and she uses OTC products to treat. Not having symptoms now.  Exam is normal. Discussed possible boric acid suppository trial 600 mg twice weekly #32 refills. Patient was to go ahead and do this and will call this into her pharmacy. Backing off to once weekly if successful reviewed. 2. Mammography. Patient has scheduled in November. SBE monthly reviewed. 3. Pap smear 2012. No Pap smear done today. No history of significant abnormal Pap smears. Plan repeat Pap smear next year at a 3 year interval. 4. Health maintenance. Was having abdominal pain earlier this year but this is all resolved with dietary changes. Baseline CBC comprehensive metabolic panel  lipid profile urinalysis ordered. Followup one year, sooner as needed.  Note: This document was prepared with digital dictation and possible smart phrase technology. Any transcriptional errors that result from this process are unintentional.   [name] MD, 11:58 AM 10/19/2012

## 2012-10-19 NOTE — Telephone Encounter (Signed)
Rx called in to [facility], left on pt voicemail rx called in.

## 2012-10-19 NOTE — Patient Instructions (Signed)
Try the boric acid vaginal suppositories twice weekly. Followup if any issues. Otherwise followup in one year for annual exam.

## 2012-10-19 NOTE — Telephone Encounter (Signed)
Message copied by Aura Camps on Fri Oct 19, 2012 12:04 PM ------      Message from: [name]      Created: Fri Oct 19, 2012 11:40 AM       Check with patients pharmacy.  I want Boric acid vaginal supp  600mg , #30 2 refills.  One per vagina 2 X weekly.  If they can't call in elsewhere and let patient know where. ------

## 2012-10-20 LAB — URINALYSIS W MICROSCOPIC + REFLEX CULTURE
Nitrite: NEGATIVE
Protein, ur: NEGATIVE mg/dL
Urobilinogen, UA: 0.2 mg/dL (ref 0.0–1.0)

## 2012-10-22 ENCOUNTER — Other Ambulatory Visit: Payer: Self-pay | Admitting: Gynecology

## 2012-10-22 ENCOUNTER — Other Ambulatory Visit: Payer: Self-pay | Admitting: *Deleted

## 2012-10-22 ENCOUNTER — Ambulatory Visit (INDEPENDENT_AMBULATORY_CARE_PROVIDER_SITE_OTHER): Payer: 59 | Admitting: Internal Medicine

## 2012-10-22 ENCOUNTER — Encounter: Payer: Self-pay | Admitting: Internal Medicine

## 2012-10-22 VITALS — BP 116/69 | HR 108 | Temp 98.1°F | Resp 18 | Ht 67.0 in | Wt 165.0 lb

## 2012-10-22 DIAGNOSIS — R946 Abnormal results of thyroid function studies: Secondary | ICD-10-CM | POA: Insufficient documentation

## 2012-10-22 DIAGNOSIS — E7800 Pure hypercholesterolemia, unspecified: Secondary | ICD-10-CM

## 2012-10-22 DIAGNOSIS — E785 Hyperlipidemia, unspecified: Secondary | ICD-10-CM

## 2012-10-22 DIAGNOSIS — J309 Allergic rhinitis, unspecified: Secondary | ICD-10-CM

## 2012-10-22 DIAGNOSIS — R7309 Other abnormal glucose: Secondary | ICD-10-CM

## 2012-10-22 DIAGNOSIS — G4700 Insomnia, unspecified: Secondary | ICD-10-CM

## 2012-10-22 DIAGNOSIS — N393 Stress incontinence (female) (male): Secondary | ICD-10-CM | POA: Insufficient documentation

## 2012-10-22 DIAGNOSIS — J358 Other chronic diseases of tonsils and adenoids: Secondary | ICD-10-CM | POA: Insufficient documentation

## 2012-10-22 DIAGNOSIS — Z1329 Encounter for screening for other suspected endocrine disorder: Secondary | ICD-10-CM

## 2012-10-22 LAB — URINE CULTURE: Colony Count: >=100000

## 2012-10-22 MED ORDER — ALPRAZOLAM 0.25 MG PO TABS: ORAL_TABLET | ORAL | Status: DC

## 2012-10-22 MED ORDER — SULFAMETHOXAZOLE-TMP DS 800-160 MG PO TABS: 1.0000 | ORAL_TABLET | Freq: Two times a day (BID) | ORAL | Status: DC

## 2012-10-22 MED ORDER — AZELASTINE HCL 0.15 % NA SOLN: NASAL | Status: DC

## 2012-10-23 ENCOUNTER — Other Ambulatory Visit: Payer: Self-pay | Admitting: *Deleted

## 2012-10-23 ENCOUNTER — Encounter: Payer: Self-pay | Admitting: *Deleted

## 2012-10-23 MED ORDER — AZELASTINE HCL 0.15 % NA SOLN: NASAL | Status: DC

## 2012-10-23 NOTE — Progress Notes (Signed)
Subjective:    Patient ID: Michele Manning, female    DOB: 14-Sep-1965, 47 y.o.   MRN: [id number]  HPI  [name] is here for first visit  Establish primary care  PMH of tonsillar cyst and thyroid gland calcification followed by ENT Dr. Jearld Fenton, breast fibroadenoma,  Anxiety  (Dr. Everlene Other therapist), chronic insomnia,  Hyperlipidemia,  Allergic rhinitis (well controlled with Astepro),  IBS  And stress incontinence  See CT scan of neck    Report of 3 mm calcification in R thyroid and slight lymph enlargement. Pt tells me that Dr. Jearld Fenton has ordered a follow up imaging in 2 months.  No FH of thyroid cancer no neck irradiation to pt.  Recent labs from Dr. Audie Box show minimal hyperlipidemia but I do not see a TSH   She has chronic insomnia that she uses an occasional Xanax for    She also tells me she has chronic vaginal yeast infections that she sees Dr. Audie Box for .  He has her on Boric acid suppositories now  No Known Allergies Past Medical History  Diagnosis Date  . 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 IBS (irritable bowel syndrome)     LACTOSE INTOLERANT  . Lactose intolerance   . Hyperlipemia   . Anxiety    Past Surgical History  Procedure Laterality Date  . Breast fibroadenoma surgery  05/2009    FIBROADENOMA RIGHT BREAST   History   Social History  . Marital Status: Married    Spouse Name: N/A    Number of Children: 3  . 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 Smoking status: Former Smoker    Quit date: 10/23/2003  . Smokeless tobacco: Never Used  . Alcohol Use: 7.0 oz/week    14 drink(s) per week     Comment: 2-3 glasses daily  . Drug Use: No  . Sexual Activity: Yes    Partners: Male    Birth Control/ Protection: Other-see comments, Surgical     Comment: vasectomy-husband   Other Topics Concern  . Not on file   Social History Narrative  . No narrative on file   Family  History  Problem Relation Age of Onset  . Breast cancer Maternal Grandmother     early fifties  . COPD Father    Patient Active Problem List   Diagnosis Date Noted  . Insomnia 10/22/2012  . Hyperlipidemia 10/22/2012  . Stress incontinence 10/22/2012  . Tonsillar cyst 10/22/2012  . Abnormal thyroid scan 10/22/2012  . Allergic rhinitis 10/22/2012  . Fibroadenoma of breast   . IBS (irritable bowel syndrome)    Current Outpatient Prescriptions on File Prior to Visit  Medication Sig Dispense Refill  . fexofenadine-pseudoephedrine (ALLEGRA-D) 60-120 MG per tablet Take 1 tablet by mouth daily.       . hyoscyamine (LEVSIN/SL) 0.125 MG SL tablet Place 1 tablet (0.125 mg total) under the tongue every 4 (four) hours as needed for cramping.  60 tablet  2  . NONFORMULARY OR COMPOUNDED ITEM Boric acid vaginal supp. 600 mg one per vagina twice weekly  30 each  2  . Probiotic Product (PROBIOTIC PO) Take by mouth.       No current facility-administered medications on file prior to visit.      Review of Systems    see HPI Objective:   Physical Exam Physical Exam  Nursing note and  vitals reviewed.  Constitutional: She is oriented to person, place, and time. She appears well-developed and well-nourished.  HENT:  Head: Normocephalic and atraumatic.  Cardiovascular: Normal rate and regular rhythm. Exam reveals no gallop and no friction rub.  No murmur heard.  Pulmonary/Chest: Breath sounds normal. She has no wheezes. She has no rales.  Neurological: She is alert and oriented to person, place, and time.  Skin: Skin is warm and dry.  Psychiatric: She has a normal mood and affect. Her behavior is normal.           Assessment & Plan:  Thyroid calcification  Will check TSH today.  I advised pt to be sure to get her follow up imaging as advised and ordered by Dr. Jearld Fenton.  She is not sure when test is scheduled.  I advised her that after her xray is done to schedule appt with me and we will go  over results as well as her ENT  Allergic rhinitis  OK to use AStepro on a temporary basis  Advised to come off in winter momths  Chronic insomnia/anxiety  :  OK for limited use of Xanax  . I encouraged use of Benadryl 50 mg in between.  Advised to take Xanax at a maximum of 3 times per week. I re-ordered # 12 for her  History of breast fibroadenoma  IBS  Tonsillar cyst  Followed by ent  Vaginal yeast infections per her report.  On boric acid per dR. Fontaine  Pt advised ot see me after her next thyroid imaging study

## 2012-10-23 NOTE — Patient Instructions (Signed)
Make appt with me after your next thyroid xray is complete  Get labs today

## 2012-10-29 ENCOUNTER — Telehealth: Payer: Self-pay | Admitting: *Deleted

## 2012-10-29 MED ORDER — CIPROFLOXACIN HCL 250 MG PO TABS: 250.0000 mg | ORAL_TABLET | Freq: Two times a day (BID) | ORAL | Status: DC

## 2012-10-29 NOTE — Telephone Encounter (Signed)
Pt informed, rx sent 

## 2012-10-29 NOTE — Telephone Encounter (Signed)
Pt took complete dose of Cipro x 3 days on 10/22/12, pt said she feels like infection still may be there. C/o some burning with urination, pt asked if refill could be given or different rx? Please advise

## 2012-10-29 NOTE — Telephone Encounter (Signed)
Ciprofloxacin 250 mg twice a day x7 days

## 2012-11-02 ENCOUNTER — Other Ambulatory Visit: Payer: 59

## 2012-11-02 DIAGNOSIS — E7800 Pure hypercholesterolemia, unspecified: Secondary | ICD-10-CM

## 2012-11-02 DIAGNOSIS — R7309 Other abnormal glucose: Secondary | ICD-10-CM

## 2012-11-02 LAB — LIPID PANEL
Cholesterol: 214 mg/dL — ABNORMAL HIGH (ref 0–200)
LDL Cholesterol: 132 mg/dL — ABNORMAL HIGH (ref 0–99)
Triglycerides: 100 mg/dL (ref <150)
VLDL: 20 mg/dL (ref 0–40)

## 2012-11-05 ENCOUNTER — Other Ambulatory Visit: Payer: Self-pay | Admitting: Gynecology

## 2012-11-05 DIAGNOSIS — R7309 Other abnormal glucose: Secondary | ICD-10-CM

## 2012-11-05 DIAGNOSIS — E7800 Pure hypercholesterolemia, unspecified: Secondary | ICD-10-CM

## 2012-11-06 ENCOUNTER — Encounter: Payer: Self-pay | Admitting: Women's Health

## 2012-11-06 ENCOUNTER — Ambulatory Visit (INDEPENDENT_AMBULATORY_CARE_PROVIDER_SITE_OTHER): Payer: 59 | Admitting: Women's Health

## 2012-11-06 DIAGNOSIS — R300 Dysuria: Secondary | ICD-10-CM

## 2012-11-06 DIAGNOSIS — N898 Other specified noninflammatory disorders of vagina: Secondary | ICD-10-CM

## 2012-11-06 DIAGNOSIS — N899 Noninflammatory disorder of vagina, unspecified: Secondary | ICD-10-CM

## 2012-11-06 LAB — URINALYSIS W MICROSCOPIC + REFLEX CULTURE
Bilirubin Urine: NEGATIVE
Glucose, UA: NEGATIVE mg/dL
Hgb urine dipstick: NEGATIVE
Protein, ur: NEGATIVE mg/dL
Specific Gravity, Urine: 1.01 (ref 1.005–1.030)
Urobilinogen, UA: 0.2 mg/dL (ref 0.0–1.0)

## 2012-11-06 LAB — WET PREP FOR TRICH, YEAST, CLUE
Clue Cells Wet Prep HPF POC: NONE SEEN
Trich, Wet Prep: NONE SEEN
Yeast Wet Prep HPF POC: NONE SEEN

## 2012-11-06 NOTE — Progress Notes (Signed)
Patient ID: Michele Manning, female   DOB: [birth year], 47 y.o.   MRN: [id number] Presents with complaint of vaginal irritation, continued urinary frequency with some urgency, pain resolved.Marland Kitchen UTI noted at annual exam with urine culture. Initially asymptomatic. Treated with Septra for 3 days, symptoms persisted Cipro 250 twice daily for 7 days symptoms are better but not 100%. Also was started on boric acid for chronic yeast, states feels like something is causing pressure inside vagina. Denies abdominal pain, fever, discharge with itching or odor. Monthly cycles/vasectomy.  Exam: Appears well. UA: Negative. Abdomen soft nontender, external genitalia minimal erythema noted, speculum exam, vaginal walls without erythema, clear to white discharge minimal wet prep negative. Bimanual no CMT or adnexal fullness or tenderness.  Negative test of cure UA,  Plan: Reviewed normality of exam. Urogesic blue samples with instructions to take up to 4 times a day for 2 days given. Instructed to call if no relief of symptoms. Will not use boric acid until symptoms resolve.

## 2012-11-16 ENCOUNTER — Ambulatory Visit
Admission: RE | Admit: 2012-11-16 | Discharge: 2012-11-16 | Disposition: A | Discharge status: Home / Self Care | Payer: 59 | Source: Ambulatory Visit

## 2012-11-16 DIAGNOSIS — Z1231 Encounter for screening mammogram for malignant neoplasm of breast: Secondary | ICD-10-CM

## 2012-12-20 ENCOUNTER — Other Ambulatory Visit: Payer: Self-pay | Admitting: Otolaryngology

## 2012-12-20 DIAGNOSIS — R591 Generalized enlarged lymph nodes: Secondary | ICD-10-CM

## 2012-12-22 ENCOUNTER — Encounter (HOSPITAL_COMMUNITY): Payer: Self-pay | Admitting: Emergency Medicine

## 2012-12-22 ENCOUNTER — Emergency Department (INDEPENDENT_AMBULATORY_CARE_PROVIDER_SITE_OTHER)
Admission: EM | Admit: 2012-12-22 | Discharge: 2012-12-22 | Disposition: A | Discharge status: Home / Self Care | Payer: 59 | Source: Home / Self Care | Attending: Family Medicine | Admitting: Family Medicine

## 2012-12-22 DIAGNOSIS — M5430 Sciatica, unspecified side: Secondary | ICD-10-CM

## 2012-12-22 DIAGNOSIS — M5432 Sciatica, left side: Secondary | ICD-10-CM

## 2012-12-22 MED ORDER — KETOROLAC TROMETHAMINE 60 MG/2ML IM SOLN
INTRAMUSCULAR | Status: AC
  Filled 2012-12-22: qty 2

## 2012-12-22 MED ORDER — METAXALONE 800 MG PO TABS: 800.0000 mg | ORAL_TABLET | Freq: Two times a day (BID) | ORAL | Status: DC | PRN

## 2012-12-22 MED ORDER — METHYLPREDNISOLONE 4 MG PO KIT: PACK | ORAL | Status: DC

## 2012-12-22 MED ORDER — KETOROLAC TROMETHAMINE 60 MG/2ML IM SOLN
60.0000 mg | Freq: Once | INTRAMUSCULAR | Status: AC
  Administered 2012-12-22: 60 mg via INTRAMUSCULAR

## 2012-12-22 NOTE — ED Provider Notes (Signed)
CSN: [id number]     Arrival date & time 12/22/12  4782 History   First MD Initiated Contact with Patient 12/22/12 1032     Chief Complaint  Patient presents with  . Back Pain   (Consider location/radiation/quality/duration/timing/severity/associated sxs/prior Treatment) Patient is a 47 y.o. female presenting with back pain. The history is provided by the patient.  Back Pain Location:  Sacro-iliac joint (+left) Quality:  Aching Radiates to: left buttock. Pain severity:  Moderate Pain is:  Same all the time Duration: began yesterday. Timing:  Constant Progression:  Unchanged Chronicity:  New Exacerbated by: exaserbated by transitions from sitting to standing and vice versa. Ineffective treatments:  Cold packs and heating pad Associated symptoms: no abdominal pain, no bladder incontinence, no bowel incontinence, no dysuria, no fever, no headaches, no leg pain, no numbness, no paresthesias, no pelvic pain, no perianal numbness, no tingling and no weakness   Risk factors comment:  Hx of sciatica in past   Past Medical History  Diagnosis Date  . 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 IBS (irritable bowel syndrome)     LACTOSE INTOLERANT  . Lactose intolerance   . Hyperlipemia   . Anxiety    Past Surgical History  Procedure Laterality Date  . Breast fibroadenoma surgery  05/2009    FIBROADENOMA RIGHT BREAST   Family History  Problem Relation Age of Onset  . Breast cancer Maternal Grandmother     early fifties  . COPD Father    History  Substance Use Topics  . Smoking status: Former Smoker    Quit date: 10/23/2003  . Smokeless tobacco: Never Used  . Alcohol Use: 7.0 oz/week    14 drink(s) per week     Comment: 2-3 glasses daily   OB History   Grav Para Term Preterm Abortions TAB SAB Ect Mult Living   3 3        3      Review of Systems  Constitutional: Negative for fever.  Gastrointestinal: Negative for abdominal pain and bowel incontinence.   Genitourinary: Negative for bladder incontinence, dysuria and pelvic pain.  Musculoskeletal: Positive for back pain.  Neurological: Negative for tingling, weakness, numbness, headaches and paresthesias.  All other systems reviewed and are negative.    Allergies  Review of patient's allergies indicates no known allergies.  Home Medications   Current Outpatient Rx  Name  Route  Sig  Dispense  Refill  . fexofenadine-pseudoephedrine (ALLEGRA-D) 60-120 MG per tablet   Oral   Take 1 tablet by mouth daily.          Marland Kitchen ALPRAZolam (XANAX) 0.25 MG tablet      Take one tablet 3 times a week prn sleep   12 tablet   0   . Azelastine HCl 0.15 % SOLN      One spray bid for 2 weeks then one spray daily for 2 months   30 mL   1   . hyoscyamine (LEVSIN/SL) 0.125 MG SL tablet   Sublingual   Place 1 tablet (0.125 mg total) under the tongue every 4 (four) hours as needed for cramping.   60 tablet   2   . metaxalone (SKELAXIN) 800 MG tablet   Oral   Take 1 tablet (800 mg total) by mouth 2 (two) times daily as needed for muscle spasms (or back pain).   14 tablet   0   . methylPREDNISolone (MEDROL DOSEPAK) 4 MG tablet  As directed   21 tablet   0   . NONFORMULARY OR COMPOUNDED ITEM      Boric acid vaginal supp. 600 mg one per vagina twice weekly   30 each   2   . Probiotic Product (PROBIOTIC PO)   Oral   Take by mouth.          BP 129/79  Pulse 97  Temp(Src) 98 F (36.7 C) (Oral)  Resp 16  SpO2 99%  LMP 12/20/2012 Physical Exam  Nursing note and vitals reviewed. Constitutional: She is oriented to person, place, and time. She appears well-developed and well-nourished. No distress.  HENT:  Head: Normocephalic and atraumatic.  Eyes: Conjunctivae are normal.  Neck: Neck supple.  Cardiovascular: Normal rate, regular rhythm and normal heart sounds.   Pulmonary/Chest: Effort normal and breath sounds normal.  Abdominal: Soft. Bowel sounds are normal. There is no  tenderness.  Musculoskeletal:       Legs: Neurological: She is alert and oriented to person, place, and time. She has normal reflexes. She exhibits normal muscle tone. Coordination and gait normal.  CSM exam of left lower extremity normal and intact  Skin: Skin is warm and dry. No rash noted.  Psychiatric: She has a normal mood and affect. Her behavior is normal.    ED Course  Procedures (including critical care time) Labs Review Labs Reviewed - No data to display Imaging Review No results found.  EKG Interpretation    Date/Time:    Ventricular Rate:    PR Interval:    QRS Duration:   QT Interval:    QTC Calculation:   R Axis:     Text Interpretation:              MDM   A) Sciatica left  P) Medrol dose pack, Skelaxin, F/u with PCP prn.  Discussed warning signs or symptoms. Please see discharge instructions. Patient expresses understanding.    Jess Barters [place], [place] 12/22/12 1128  Medical screening examination/treatment/procedure(s) were performed by a resident physician or non-physician practitioner and as the supervising physician I was immediately available for consultation/collaboration.  Clementeen Graham, MD   Rodolph Bong, MD 12/23/12 [phone]

## 2012-12-22 NOTE — ED Notes (Signed)
Pt is unable to provide Urine sample at this time.

## 2012-12-22 NOTE — ED Notes (Signed)
Pt c/o left lower back pain onset 2100 yest... Reports she had her legs propped up on the computer desk and when she got up to stand, she felt the pain Pain increases w/activity... Has tried heat/ice to the site Denies: f/v/n/d, urinary sxs She is alert w/no signs of acute distress.

## 2013-01-03 DIAGNOSIS — D249 Benign neoplasm of unspecified breast: Secondary | ICD-10-CM

## 2013-01-03 HISTORY — PX: BIOPSY THYROID: PRO38

## 2013-01-03 HISTORY — DX: Benign neoplasm of unspecified breast: D24.9

## 2013-01-18 ENCOUNTER — Ambulatory Visit
Admission: RE | Admit: 2013-01-18 | Discharge: 2013-01-18 | Disposition: A | Discharge status: Home / Self Care | Payer: 59 | Source: Ambulatory Visit | Attending: Otolaryngology | Admitting: Otolaryngology

## 2013-01-18 DIAGNOSIS — R591 Generalized enlarged lymph nodes: Secondary | ICD-10-CM

## 2013-02-04 ENCOUNTER — Other Ambulatory Visit: Payer: Self-pay | Admitting: Otolaryngology

## 2013-02-04 DIAGNOSIS — E041 Nontoxic single thyroid nodule: Secondary | ICD-10-CM

## 2013-02-13 ENCOUNTER — Ambulatory Visit: Payer: 59 | Admitting: Internal Medicine

## 2013-02-14 ENCOUNTER — Ambulatory Visit: Payer: 59 | Admitting: Internal Medicine

## 2013-02-18 ENCOUNTER — Ambulatory Visit: Payer: 59 | Admitting: Internal Medicine

## 2013-02-27 ENCOUNTER — Ambulatory Visit (INDEPENDENT_AMBULATORY_CARE_PROVIDER_SITE_OTHER): Payer: 59 | Admitting: Internal Medicine

## 2013-02-27 ENCOUNTER — Encounter: Payer: Self-pay | Admitting: Internal Medicine

## 2013-02-27 VITALS — BP 127/70 | HR 101 | Temp 98.3°F | Resp 18 | Wt 173.0 lb

## 2013-02-27 DIAGNOSIS — G4700 Insomnia, unspecified: Secondary | ICD-10-CM

## 2013-02-27 DIAGNOSIS — E041 Nontoxic single thyroid nodule: Secondary | ICD-10-CM

## 2013-02-27 DIAGNOSIS — J309 Allergic rhinitis, unspecified: Secondary | ICD-10-CM

## 2013-02-27 MED ORDER — ALPRAZOLAM 0.25 MG PO TABS: ORAL_TABLET | ORAL | Status: DC

## 2013-02-27 MED ORDER — AZELASTINE HCL 0.15 % NA SOLN: NASAL | Status: DC

## 2013-02-27 NOTE — Patient Instructions (Signed)
Refer to Dr. Cruzita Lederer  Thyroid nodule  Refer before her biopsy appointment  Set up CPE with me

## 2013-02-27 NOTE — Progress Notes (Signed)
Subjective:    Patient ID: Michele Manning, female    DOB: 1965/01/19, 48 y.o.   MRN: [id number]  HPI  Michele Manning is here for follow up  Nasal allergies flared   She needs more Asteline  Using occasional Xanax for insomnia    See recent thyroid ultrasound  1 cm  Nodule with calcification  .  She is asymptomatic  No XRT to head or neck.  Mother has graves.  Her ENT recommended biopsy and she would like to talk to endocrinologist before having biopsy  No Known Allergies Past Medical History  Diagnosis Date  . 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 IBS (irritable bowel syndrome)     LACTOSE INTOLERANT  . Lactose intolerance   . Hyperlipemia   . Anxiety   . Thyroid nodule    Past Surgical History  Procedure Laterality Date  . Breast fibroadenoma surgery  05/2009    FIBROADENOMA RIGHT BREAST   History   Social History  . Marital Status: Married    Spouse Name: N/A    Number of Children: 3  . 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 Smoking status: Former Smoker    Quit date: 10/23/2003  . Smokeless tobacco: Never Used  . Alcohol Use: 7.0 oz/week    14 drink(s) per week     Comment: 2-3 glasses daily  . Drug Use: No  . Sexual Activity: Yes    Partners: Male    Birth Control/ Protection: Other-see comments, Surgical     Comment: vasectomy-husband   Other Topics Concern  . Not on file   Social History Narrative  . No narrative on file   Family History  Problem Relation Age of Onset  . Breast cancer Maternal Grandmother     early fifties  . COPD Father    Patient Active Problem List   Diagnosis Date Noted  . Insomnia 10/22/2012  . Hyperlipidemia 10/22/2012  . Stress incontinence 10/22/2012  . Tonsillar cyst 10/22/2012  . Abnormal thyroid scan 10/22/2012  . Allergic rhinitis 10/22/2012  . Fibroadenoma of breast   . IBS (irritable bowel syndrome)    Current Outpatient  Prescriptions on File Prior to Visit  Medication Sig Dispense Refill  . ALPRAZolam (XANAX) 0.25 MG tablet Take one tablet 3 times a week prn sleep  12 tablet  0  . Azelastine HCl 0.15 % SOLN One spray bid for 2 weeks then one spray daily for 2 months  30 mL  1  . fexofenadine-pseudoephedrine (ALLEGRA-D) 60-120 MG per tablet Take 1 tablet by mouth daily.       . Probiotic Product (PROBIOTIC PO) Take by mouth.      . hyoscyamine (LEVSIN/SL) 0.125 MG SL tablet Place 1 tablet (0.125 mg total) under the tongue every 4 (four) hours as needed for cramping.  60 tablet  2   No current facility-administered medications on file prior to visit.      Review of Systems    see HPI Objective:   Physical Exam  Physical Exam  Nursing note and vitals reviewed.  Constitutional: She is oriented to person, place, and time. She appears well-developed and well-nourished.  HENT:  Head: Normocephalic and atraumatic.  Neck  No cervical adenopathy no discrete thyroid  nodule  Cardiovascular: Normal rate and regular rhythm. Exam reveals no gallop and no friction rub.  No murmur heard.  Pulmonary/Chest: Breath sounds normal. She has no wheezes. She has no rales.  Neurological: She is alert and oriented to person, place, and time.  Skin: Skin is warm and dry.  Psychiatric: She has a normal mood and affect. Her behavior is normal.        Assessment & Plan:  Thyroid  Nodule  Ok to get second opinion with endocrinologist  Will refer  Allergic rhinitis  Refill Astelize  Insomnnia  OK to refill Xanax #12    Schedule CPE

## 2013-03-12 ENCOUNTER — Other Ambulatory Visit: Payer: 59

## 2013-03-15 ENCOUNTER — Encounter: Payer: Self-pay | Admitting: Internal Medicine

## 2013-03-15 ENCOUNTER — Ambulatory Visit (INDEPENDENT_AMBULATORY_CARE_PROVIDER_SITE_OTHER): Payer: 59 | Admitting: Internal Medicine

## 2013-03-15 VITALS — BP 124/68 | HR 91 | Temp 98.3°F | Resp 12 | Ht 67.0 in | Wt 174.0 lb

## 2013-03-15 DIAGNOSIS — E042 Nontoxic multinodular goiter: Secondary | ICD-10-CM

## 2013-03-15 DIAGNOSIS — E041 Nontoxic single thyroid nodule: Secondary | ICD-10-CM

## 2013-03-15 NOTE — Progress Notes (Signed)
Patient ID: Michele Manning, female   DOB: 1965/12/03, 48 y.o.   MRN: [id number]   HPI  Michele Manning is a 48 y.o.-year-old female, referred by her PCP, Dr. Coralyn Mark, for evaluation for MNG.   She had a cyst on her tonsils >> investigated by a CT scan in 09/2012 >> incidentally found a nodule >> thyroid ultrasound (01/2013): several small nodules, largest lesion 10 x 9 x 9 cm with coarse calcification, mid lobe, corresponding to CT finding  I reviewed pt's thyroid tests: Lab Results  Component Value Date   TSH 2.330 10/22/2012    Pt denies feeling nodules in neck, hoarseness, dysphagia/odynophagia, SOB with lying down.  Pt denies: - heat intolerance/cold intolerance - tremors - palpitations - anxiety/depression - hyperdefecation/constipation - weight loss - weight gain - dry skin - hair falling - problems with concentration - fatigue  Pt does have a FH of thyroid dsves ds.) - in mother Berenice Primas Ds.). No FH of thyroid cancer. No h/o radiation tx to head or neck.  No seaweed or kelp, no recent contrast studies. No steroid use. No herbal supplements.   I reviewed her chart and she also has a history of HL, IBS - much better.   ROS: Constitutional: no weight gain/loss, no fatigue, no subjective hyperthermia/hypothermia Eyes: no blurry vision, no xerophthalmia ENT: no sore throat, no nodules palpated in throat, no dysphagia/odynophagia, no hoarseness Cardiovascular: no CP/SOB/palpitations/leg swelling Respiratory: no cough/SOB Gastrointestinal: no N/V/D/C Musculoskeletal: no muscle/joint aches Skin: no rashes Neurological: no tremors/numbness/tingling/dizziness Psychiatric: no depression/anxiety  Past Medical History  Diagnosis Date  . 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 IBS (irritable bowel syndrome)     LACTOSE INTOLERANT  . Lactose intolerance   . Hyperlipemia   . Anxiety   . Thyroid nodule    Past Surgical History  Procedure Laterality Date  .  Breast fibroadenoma surgery  05/2009    FIBROADENOMA RIGHT BREAST   History   Social History  . 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 Smoking status: Former Smoker    Quit date: 10/23/2003  . Smokeless tobacco: Never Used  . Alcohol Use: 7.0 oz/week    14 drink(s) per week     Comment: 2-3 glasses daily  . Drug Use: No  . Sexual Activity: Yes    Partners: Male    Birth Control/ Protection: Other-see comments, Surgical     Comment: vasectomy-husband   Current Outpatient Prescriptions on File Prior to Visit  Medication Sig Dispense Refill  . ALPRAZolam (XANAX) 0.25 MG tablet Take one tablet 3 times a week prn sleep  12 tablet  0  . Azelastine HCl 0.15 % SOLN One spray daily fo 4 weeks then stop  30 mL  0  . Cranberry 1000 MG CAPS Take 1 capsule by mouth daily.      . fexofenadine-pseudoephedrine (ALLEGRA-D) 60-120 MG per tablet Take 1 tablet by mouth daily.       . hyoscyamine (LEVSIN/SL) 0.125 MG SL tablet Place 1 tablet (0.125 mg total) under the tongue every 4 (four) hours as needed for cramping.  60 tablet  2  . OVER THE COUNTER MEDICATION 1 tablet daily. Yeast guard      . Probiotic Product (PROBIOTIC PO) Take by mouth.       No current facility-administered  medications on file prior to visit.   No Known Allergies Family History  Problem Relation Age of Onset  . Breast cancer Maternal Grandmother     early fifties  . COPD Father    PE: BP 124/68  Pulse 91  Temp(Src) 98.3 F (36.8 C) (Oral)  Resp 12  Ht 5\' 7"  (1.702 m)  Wt 174 lb (78.926 kg)  BMI 27.25 kg/m2  SpO2 98%  LMP 03/09/2013 Wt Readings from Last 3 Encounters:  03/15/13 174 lb (78.926 kg)  02/27/13 173 lb (78.472 kg)  10/22/12 165 lb (74.844 kg)   Constitutional: overweight, in NAD Eyes: PERRLA, EOMI, no exophthalmos ENT: moist mucous membranes, + thyromegaly, no cervical  lymphadenopathy Cardiovascular: RRR, No MRG Respiratory: CTA B Gastrointestinal: abdomen soft, NT, ND, BS+ Musculoskeletal: no deformities, strength intact in all 4;  Skin: moist, warm, no rashes Neurological: no tremor with outstretched hands, DTR normal in all 4  ASSESSMENT: 1. MNG - thyroid U/S (01/2013):   Right thyroid lobe: 47 x 14 x 15 mm. Fairly homogeneous background echotexture. Several small nodules, largest lesion 10 x 9 x 9 cm with coarse calcification, mid lobe, corresponding to CT finding. Others are all less than 4 mm.   Left thyroid lobe: 45 x 12 x 13 mm. Fairly homogeneous background echotexture. 7 x 5 x 11 mm hypoechoic solid nodule, inferior pole. Adjacent 4.6 mm hypoechoic/ cystic lesion.   Isthmus : 2.2 mm. No nodules visualized.   Lymphadenopathy: None visualized.  PLAN: 1. MNG  - I reviewed the images of her thyroid ultrasound along with the patient and her husband. I pointed out that the dominant nodule is not large large but is hypoechoic, with a large calcification in the center, without internal blood flow, more wide than tall, but not well delimited from surrounding tissue. Pt does not have a thyroid cancer family history or a personal history of RxTx to head/neck.  - the only way that we can tell exactly if it is cancer or not is by doing a thyroid biopsy (FNA). I explained what the test entails. I did explain that her nodule is smaller then the threshold for Bx (1.5 cm), but it has some concerning features (calcification, irregular margins) so I think she will benefit from the FNA - We discussed about other options, to wait for another 12 months to a year and see if the nodule grows, and only intervene at that time, but she opted not to do this  - I explained that this is not cancer, we can continue to follow her on a yearly basis, and check another ultrasound in another year or 2. - patient decided to have the FNA done now >> I ordered this.  - I'll see her  back in a year, assuming her FNA is normal. If FNA abnormal, we will meet sooner.  - I advised pt to join my chart and I will send her the results through there   03/28/2013 FNA: Adequacy Reason Satisfactory For Evaluation. Diagnosis THYROID, FINE NEEDLE ASPIRATION, RIGHT (SPECIMEN 1 OF 1, COLLECTED ON 03/27/13): BENIGN (BETHESDA CLASS II). FINDINGS CONSISTENT WITH BENIGN FOLLICULAR NODULE. Claudette Laws MD Pathologist, Electronic Signature (Case signed 03/28/2013)  I will let pt know.

## 2013-03-15 NOTE — Patient Instructions (Signed)
Please return in 1 year. You will be called about the nodule biopsy.  Please join MyChart >> I will send you the biopsy results through there.

## 2013-03-27 ENCOUNTER — Ambulatory Visit
Admission: RE | Admit: 2013-03-27 | Discharge: 2013-03-27 | Disposition: A | Discharge status: Home / Self Care | Payer: 59 | Source: Ambulatory Visit | Attending: Internal Medicine | Admitting: Internal Medicine

## 2013-03-27 ENCOUNTER — Other Ambulatory Visit (HOSPITAL_COMMUNITY)
Admission: RE | Admit: 2013-03-27 | Discharge: 2013-03-27 | Disposition: A | Discharge status: Home / Self Care | Payer: 59 | Source: Ambulatory Visit | Attending: Interventional Radiology | Admitting: Interventional Radiology

## 2013-03-27 DIAGNOSIS — E041 Nontoxic single thyroid nodule: Secondary | ICD-10-CM | POA: Insufficient documentation

## 2013-03-28 DIAGNOSIS — E042 Nontoxic multinodular goiter: Secondary | ICD-10-CM | POA: Insufficient documentation

## 2013-05-12 ENCOUNTER — Emergency Department (INDEPENDENT_AMBULATORY_CARE_PROVIDER_SITE_OTHER)
Admission: EM | Admit: 2013-05-12 | Discharge: 2013-05-12 | Disposition: A | Discharge status: Home / Self Care | Payer: 59 | Source: Home / Self Care | Attending: Emergency Medicine | Admitting: Emergency Medicine

## 2013-05-12 ENCOUNTER — Encounter (HOSPITAL_COMMUNITY): Payer: Self-pay | Admitting: Emergency Medicine

## 2013-05-12 DIAGNOSIS — J039 Acute tonsillitis, unspecified: Secondary | ICD-10-CM

## 2013-05-12 LAB — POCT RAPID STREP A: Streptococcus, Group A Screen (Direct): NEGATIVE

## 2013-05-12 MED ORDER — AMOXICILLIN 500 MG PO CAPS: 500.0000 mg | ORAL_CAPSULE | Freq: Three times a day (TID) | ORAL | Status: DC

## 2013-05-12 MED ORDER — PREDNISONE 20 MG PO TABS: 20.0000 mg | ORAL_TABLET | Freq: Two times a day (BID) | ORAL | Status: DC

## 2013-05-12 NOTE — Discharge Instructions (Signed)

## 2013-05-12 NOTE — ED Notes (Signed)
Patient complains of sore throat with low grade fever at night; denies nausea and vomiting; states she has cysts on tonsils.

## 2013-05-12 NOTE — ED Provider Notes (Signed)
Chief Complaint   Chief Complaint  Patient presents with  . 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 She denies any GI symptoms. She's had no known exposure to strep. She has had strep in the past but has been years ago. She has a large cyst on her right tonsil.   Review of Systems   Other than as noted above, the patient denies any of the following symptoms. Systemic:  No fever, chills, sweats, myalgias, or headache. Eye:  No redness, pain or drainage. ENT:  No earache, nasal congestion, sneezing, rhinorrhea, sinus pressure, sinus pain, or post nasal drip. Lungs:  No cough, sputum production, wheezing, shortness of breath, or chest pain. GI:  No abdominal pain, nausea, vomiting, or diarrhea. Skin:  No rash.  [place]   Past medical history, family history, social history, meds, and allergies were reviewed.   Physical Exam     Vital signs:  BP 107/84  Pulse 103  Temp(Src) 98.6 F (37 C) (Oral)  Resp 19  SpO2 100%  LMP 05/03/2013 General:  Alert, in no distress. Phonation was normal, no drooling, and patient was able to handle secretions well.  Eye:  No conjunctival injection or drainage. Lids were normal. ENT:  TMs and canals were normal, without erythema or inflammation.  Nasal mucosa was clear and uncongested, without drainage.  Mucous membranes were moist.  Exam of pharynx reveals both tonsils to be enlarged, red, and covered with exudate.  There were no oral ulcerations or lesions. There was no bulging of the tonsillar pillars, and the uvula was midline. Neck:  Supple, she has bilateral, tender anterior cervical lymphadenopathy. Lungs:  No respiratory distress.  Lungs were clear to auscultation, without wheezes, rales or rhonchi.  Breath sounds  were clear and equal bilaterally.  Heart:  Regular rhythm, without gallops, murmers or rubs. Skin:  Clear, warm, and dry, without rash or lesions.  Labs   Results for orders placed during the hospital encounter of 05/12/13  POCT RAPID STREP A ([facility] ONLY)      Result Value Ref Range   Streptococcus, Group A Screen (Direct) NEGATIVE  NEGATIVE   Assessment   The encounter diagnosis was Tonsillitis.  There is no evidence of a peritonsillar abscess, retropharyngeal abscess, or epiglottitis.  Centor score is 3, therefore will treat with antibiotics.  Plan     1.  Meds:  The following meds were prescribed:   Discharge Medication List as of 05/12/2013  9:55 AM    START taking these medications   Details  amoxicillin (AMOXIL) 500 MG capsule Take 1 capsule (500 mg total) by mouth 3 (three) times daily., Starting 05/12/2013, Until Discontinued, Normal    predniSONE (DELTASONE) 20 MG tablet Take 1 tablet (20 mg total) by mouth 2 (two) times daily., Starting 05/12/2013, Until Discontinued, Normal        2.  Patient Education/Counseling:  The patient was given appropriate handouts, self care instructions, and instructed in symptomatic relief, including hot saline gargles, throat lozenges, infectious precautions, and need to trade out toothbrush.    3.  Follow up:  The patient was told to follow up here if no better in 3 to 4 days, or sooner if becoming worse in any way, and given some  red flag symptoms such as difficulty swallowing or breathing which would prompt immediate return.      Harden Mo, MD 05/12/13 [phone]

## 2013-05-14 ENCOUNTER — Other Ambulatory Visit: Payer: Self-pay | Admitting: *Deleted

## 2013-05-14 LAB — CULTURE, GROUP A STREP

## 2013-05-14 NOTE — Telephone Encounter (Signed)
Refill request

## 2013-05-15 MED ORDER — AZELASTINE HCL 0.15 % NA SOLN: NASAL | Status: DC

## 2013-05-30 ENCOUNTER — Emergency Department (INDEPENDENT_AMBULATORY_CARE_PROVIDER_SITE_OTHER)
Admission: EM | Admit: 2013-05-30 | Discharge: 2013-05-30 | Disposition: A | Discharge status: Home / Self Care | Payer: 59 | Source: Home / Self Care | Attending: Emergency Medicine | Admitting: Emergency Medicine

## 2013-05-30 ENCOUNTER — Encounter (HOSPITAL_COMMUNITY): Payer: Self-pay | Admitting: Emergency Medicine

## 2013-05-30 DIAGNOSIS — J45909 Unspecified asthma, uncomplicated: Secondary | ICD-10-CM

## 2013-05-30 MED ORDER — ALBUTEROL SULFATE HFA 108 (90 BASE) MCG/ACT IN AERS: 2.0000 | INHALATION_SPRAY | Freq: Four times a day (QID) | RESPIRATORY_TRACT | Status: DC

## 2013-05-30 MED ORDER — HYDROCOD POLST-CHLORPHEN POLST 10-8 MG/5ML PO LQCR: 5.0000 mL | Freq: Two times a day (BID) | ORAL | Status: DC | PRN

## 2013-05-30 MED ORDER — PREDNISONE 20 MG PO TABS: ORAL_TABLET | ORAL | Status: DC

## 2013-05-30 NOTE — ED Provider Notes (Signed)
  Chief Complaint   Chief Complaint  Patient presents with  . 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 She finished up her antibiotics and felt well for a few days up until 5 days ago when she developed nasal congestion with yellow drainage, headache, dry cough, chest tightness, wheezing, and difficulty breathing. She has no history of asthma or cigarette use. Her chest feels raw but does not hurt. She's had a scratchy throat and loss of appetite. She denies any fever, chills, or other GI symptoms.  Review of Systems   Other than as noted above, the patient denies any of the following symptoms: Systemic:  No fevers, chills, sweats, or myalgias. Eye:  No redness or discharge. ENT:  No ear pain, headache, nasal congestion, drainage, sinus pressure, or sore throat. Neck:  No neck pain, stiffness, or swollen glands. Lungs:  No cough, sputum production, hemoptysis, wheezing, chest tightness, shortness of breath or chest pain. GI:  No abdominal pain, nausea, vomiting or diarrhea.  [place]   Past medical history, family history, social history, meds, and allergies were reviewed.   Physical exam   Vital signs:  BP 123/64  Pulse 96  Temp(Src) 97.8 F (36.6 C) (Oral)  Resp 16  SpO2 100%  LMP 05/03/2013 General:  Alert and oriented.  In no distress.  Skin warm and dry. Eye:  No conjunctival injection or drainage. Lids were normal. ENT:  TMs and canals were normal, without erythema or inflammation.  Nasal mucosa was clear and uncongested, without drainage.  Mucous membranes were moist.  Pharynx was clear with no exudate or drainage.  There were no oral ulcerations or lesions. Neck:  Supple, no adenopathy, tenderness or mass. Lungs:  No respiratory distress.  She has minimal scattered wheezes bilaterally with good air movement, and no rales or rhonchi.  Heart:  Regular rhythm,  without gallops, murmers or rubs. Skin:  Clear, warm, and dry, without rash or lesions.  Assessment     The encounter diagnosis was Asthmatic bronchitis.  Plan    1.  Meds:  The following meds were prescribed:   New Prescriptions   ALBUTEROL (PROVENTIL HFA;VENTOLIN HFA) 108 (90 BASE) MCG/ACT INHALER    Inhale 2 puffs into the lungs 4 (four) times daily.   CHLORPHENIRAMINE-HYDROCODONE (TUSSIONEX) 10-8 MG/5ML LQCR    Take 5 mLs by mouth every 12 (twelve) hours as needed for cough.   PREDNISONE (DELTASONE) 20 MG TABLET    Take 3 daily for 4 days, 2 daily for 4 days, 1 daily for 4 days.    2.  Patient Education/Counseling:  The patient was given appropriate handouts, self care instructions, and instructed in symptomatic relief.  Instructed to get extra fluids, rest, and use a cool mist vaporizer.    3.  Follow up:  The patient was told to follow up here if no better in 3 to 4 days, or sooner if becoming worse in any way, and given some red flag symptoms such as increasing fever, difficulty breathing, chest pain, or persistent vomiting which would prompt immediate return.  Follow up here as needed.      Harden Mo, MD 05/30/13 [phone]

## 2013-05-30 NOTE — ED Notes (Signed)
C/o cough States she feels as if she has a sinus infection She received antibiotics from [facility] on May 10 States she has sinus drainage, coughing which makes her chest feels dry denies any vomiting and diarrhea

## 2013-05-30 NOTE — Discharge Instructions (Signed)
How to Use an Inhaler Proper inhaler technique is very important. Good technique ensures that the medicine reaches the lungs. Poor technique results in depositing the medicine on the tongue and back of the throat rather than in the airways. If you do not use the inhaler with good technique, the medicine will not help you. STEPS TO FOLLOW IF USING AN INHALER WITHOUT AN EXTENSION TUBE 1. Remove the cap from the inhaler. 2. If you are using the inhaler for the first time, you will need to prime it. Shake the inhaler for 5 seconds and release four puffs into the air, away from your face. Ask your health care provider or pharmacist if you have questions about priming your inhaler. 3. Shake the inhaler for 5 seconds before each breath in (inhalation). 4. Position the inhaler so that the top of the canister faces up. 5. Put your index finger on the top of the medicine canister. Your thumb supports the bottom of the inhaler. 6. Open your mouth. 7. Either place the inhaler between your teeth and place your lips tightly around the mouthpiece, or hold the inhaler 1 2 inches away from your open mouth. If you are unsure of which technique to use, ask your health care provider. 8. Breathe out (exhale) normally and as completely as possible. 9. Press the canister down with your index finger to release the medicine. 10. At the same time as the canister is pressed, inhale deeply and slowly until your lungs are completely filled. This should take 4 6 seconds. Keep your tongue down. 11. Hold the medicine in your lungs for 5 10 seconds (10 seconds is best). This helps the medicine get into the small airways of your lungs. 12. Breathe out slowly, through pursed lips. Whistling is an example of pursed lips. 13. Wait at least 15 30 seconds between puffs. Continue with the above steps until you have taken the number of puffs your health care provider has ordered. Do not use the inhaler more than your health care provider  tells you. 14. Replace the cap on the inhaler. 15. Follow the directions from your health care provider or the inhaler insert for cleaning the inhaler. STEPS TO FOLLOW IF USING AN INHALER WITH AN EXTENSION (SPACER) 1. Remove the cap from the inhaler. 2. If you are using the inhaler for the first time, you will need to prime it. Shake the inhaler for 5 seconds and release four puffs into the air, away from your face. Ask your health care provider or pharmacist if you have questions about priming your inhaler. 3. Shake the inhaler for 5 seconds before each breath in (inhalation). 4. Place the open end of the spacer onto the mouthpiece of the inhaler. 5. Position the inhaler so that the top of the canister faces up and the spacer mouthpiece faces you. 6. Put your index finger on the top of the medicine canister. Your thumb supports the bottom of the inhaler and the spacer. 7. Breathe out (exhale) normally and as completely as possible. 8. Immediately after exhaling, place the spacer between your teeth and into your mouth. Close your lips tightly around the spacer. 9. Press the canister down with your index finger to release the medicine. 10. At the same time as the canister is pressed, inhale deeply and slowly until your lungs are completely filled. This should take 4 6 seconds. Keep your tongue down and out of the way. 11. Hold the medicine in your lungs for 5 10 seconds (10  seconds is best). This helps the medicine get into the small airways of your lungs. Exhale. 12. Repeat inhaling deeply through the spacer mouthpiece. Again hold that breath for up to 10 seconds (10 seconds is best). Exhale slowly. If it is difficult to take this second deep breath through the spacer, breathe normally several times through the spacer. Remove the spacer from your mouth. 13. Wait at least 15 30 seconds between puffs. Continue with the above steps until you have taken the number of puffs your health care provider has  ordered. Do not use the inhaler more than your health care provider tells you. 14. Remove the spacer from the inhaler, and place the cap on the inhaler. 15. Follow the directions from your health care provider or the inhaler insert for cleaning the inhaler and spacer. If you are using different kinds of inhalers, use your quick relief medicine to open the airways 10 15 minutes before using a steroid if instructed to do so by your health care provider. If you are unsure which inhalers to use and the order of using them, ask your health care provider, nurse, or respiratory therapist. If you are using a steroid inhaler, always rinse your mouth with water after your last puff, then gargle and spit out the water. Do not swallow the water. AVOID:  Inhaling before or after starting the spray of medicine. It takes practice to coordinate your breathing with triggering the spray.  Inhaling through the nose (rather than the mouth) when triggering the spray. HOW TO DETERMINE IF YOUR INHALER IS FULL OR NEARLY EMPTY You cannot know when an inhaler is empty by shaking it. A few inhalers are now being made with dose counters. Ask your health care provider for a prescription that has a dose counter if you feel you need that extra help. If your inhaler does not have a counter, ask your health care provider to help you determine the date you need to refill your inhaler. Write the refill date on a calendar or your inhaler canister. Refill your inhaler 7 10 days before it runs out. Be sure to keep an adequate supply of medicine. This includes making sure it is not expired, and that you have a spare inhaler.  SEEK MEDICAL CARE IF:   Your symptoms are only partially relieved with your inhaler.  You are having trouble using your inhaler.  You have some increase in phlegm. SEEK IMMEDIATE MEDICAL CARE IF:   You feel little or no relief with your inhalers. You are still wheezing and are feeling shortness of breath or  tightness in your chest or both.  You have dizziness, headaches, or a fast heart rate.  You have chills, fever, or night sweats.  You have a noticeable increase in phlegm production, or there is blood in the phlegm. MAKE SURE YOU:   Understand these instructions.  Will watch your condition.  Will get help right away if you are not doing well or get worse. Document Released: 12/18/1999 Document Revised: 10/10/2012 Document Reviewed: 07/19/2012 [facility] Patient Information 2014 [place], [place].  Most upper respiratory infections are caused by viruses and do not require antibiotics.  We try to save the antibiotics for when we really need them to prevent bacteria from developing resistance to them.  Here are a few hints about things that can be done at home to help get over an upper respiratory infection quicker:  Get extra sleep and extra fluids.  Get 7 to 9 hours of sleep per  night and 6 to 8 glasses of water a day.  Getting extra sleep keeps the immune system from getting run down.  Most people with an upper respiratory infection are a little dehydrated.  The extra fluids also keep the secretions liquified and easier to deal with.  Also, get extra vitamin C.  4000 mg per day is the recommended dose. For the aches, headache, and fever, acetaminophen or ibuprofen are helpful.  These can be alternated every 4 hours.  People with liver disease should avoid large amounts of acetaminophen, and people with ulcer disease, gastroesophageal reflux, gastritis, congestive heart failure, chronic kidney disease, coronary artery disease and the elderly should avoid ibuprofen. For nasal congestion try Mucinex-D, or if you're having lots of sneezing or clear nasal drainage use Zyrtec-D. People with high blood pressure can take these if their blood pressure is controlled, if not, it's best to avoid the forms with a "D" (decongestants).  You can use the plain Mucinex, Allegra, Claritin, or Zyrtec even if your blood  pressure is not controlled.   A Saline nasal spray such as Ocean Spray can also help.  You can add a decongestant sprays such as Afrin, but you should not use the decongestant sprays for more than 3 or 4 days since they can be habituating.  Breathe Rite nasal strips can also offer a non-drug alternative treatment to nasal congestion, especially at night. For people with symptoms of sinusitis, sleeping with your head elevated can be helpful.  For sinus pain, moist, hot compresses to the face may provide some relief.  Many people find that inhaling steam as in a shower or from a pot of steaming water can help. For any viral infection, zinc containing lozenges such as Cold-Eze or Zicam are helpful.  Zinc helps to fight viral infection.  Hot salt water gargles (8 oz of hot water, 1/2 tsp of table salt, and a pinch of baking soda) can give relief as well as hot beverages such as hot tea.  Sucrets extra strength lozenges will help the sore throat.  For the cough, take Delsym 2 tsp every 12 hours.  It has also been found recently that Aleve can help control a cough.  The dose is 1 to 2 tablets twice daily with food.  This can be combined with Delsym. (Note, if you are taking ibuprofen, you should not take Aleve as well--take one or the other.) A cool mist vaporizer will help keep your mucous membranes from drying out.   It's important when you have an upper respiratory infection not to pass the infection to others.  This involves being very careful about the following:  Frequent hand washing or use of hand sanitizer, especially after coughing, sneezing, blowing your nose or touching your face, nose or eyes. Do not shake hands or touch anyone and try to avoid touching surfaces that other people use such as doorknobs, shopping carts, telephones and computer keyboards. Use tissues and dispose of them properly in a garbage can or ziplock bag. Cough into your sleeve. Do not let others eat or drink after  you.  It's also important to recognize the signs of serious illness and get evaluated if they occur: Any respiratory infection that lasts more than 7 to 10 days.  Yellow nasal drainage and sputum are not reliable indicators of a bacterial infection, but if they last for more than 1 week, see your doctor. Fever and sore throat can indicate strep. Fever and cough can indicate influenza or pneumonia.  Any kind of severe symptom such as difficulty breathing, intractable vomiting, or severe pain should prompt you to see a doctor as soon as possible.   Your body's immune system is really the thing that will get rid of this infection.  Your immune system is comprised of 2 types of specialized cells called T cells and B cells.  T cells coordinate the array of cells in your body that engulf invading bacteria or viruses while B cells orchestrate the production of antibodies that neutralize infection.  Anything we do or any medications we give you, will just strengthen your immune system or help it clear up the infection quicker.  Here are a few helpful hints to improve your immune system to help overcome this illness or to prevent future infections:  A few vitamins can improve the health of your immune system.  That's why your diet should include plenty of fruits, vegetables, fish, nuts, and whole grains.  Vitamin A and bet-carotene can increase the cells that fight infections (T cells and B cells).  Vitamin A is abundant in dark greens and orange vegetables such as spinach, greens, sweet potatoes, and carrots.  Vitamin B6 contributes to the maturation of white blood cells, the cells that fight disease.  Foods with vitamin B6 include cold cereal and bananas.  Vitamin C is credited with preventing colds because it increases white blood cells and also prevents cellular damage.  Citrus fruits, peaches and green and red bell peppers are all hight in vitamin C.  Vitamin E is an anti-oxidant that encourages the  production of natural killer cells which reject foreign invaders and B cells that produce antibodies.  Foods high in vitamin E include wheat germ, nuts and seeds.  Foods high in omega-3 fatty acids found in foods like salmon, tuna and mackerel boost your immune system and help cells to engulf and absorb germs.  Probiotics are good bacteria that increase your T cells.  These can be found in yogurt and are available in supplements such as Culturelle or Align.  Moderate exercise increases the strength of your immune system and your ability to recover from illness.  I suggest 3 to 5 moderate intensity 30 minute workouts per week.    Sleep is another component of maintaining a strong immune system.  It enables your body to recuperate from the day's activities, stress and work.  My recommendation is to get between 7 and 9 hours of sleep per night.  If you smoke, try to quit completely or at least cut down.  Drink alcohol only in moderation if at all.  No more than 2 drinks daily for men or 1 for women.  Get a flu vaccine early in the fall or if you have not gotten one yet, once this illness has run its course.  If you are over 65, a smoker, or an asthmatic, get a pneumococcal vaccine.  My final recommendation is to maintain a healthy weight.  Excess weight can impair the immune system by interfering with the way the immune system deals with invading viruses or bacteria.

## 2013-06-05 ENCOUNTER — Encounter: Payer: Self-pay | Admitting: Internal Medicine

## 2013-06-05 ENCOUNTER — Ambulatory Visit (INDEPENDENT_AMBULATORY_CARE_PROVIDER_SITE_OTHER): Payer: 59 | Admitting: Internal Medicine

## 2013-06-05 VITALS — BP 121/69 | HR 100 | Temp 98.2°F | Resp 18 | Ht 67.0 in | Wt 177.0 lb

## 2013-06-05 DIAGNOSIS — J329 Chronic sinusitis, unspecified: Secondary | ICD-10-CM

## 2013-06-05 DIAGNOSIS — J309 Allergic rhinitis, unspecified: Secondary | ICD-10-CM

## 2013-06-05 MED ORDER — AZITHROMYCIN 250 MG PO TABS: ORAL_TABLET | ORAL | Status: DC

## 2013-06-05 NOTE — Progress Notes (Signed)
Subjective:    Patient ID: Michele Manning, female    DOB: 10-30-1965, 48 y.o.   MRN: [id number]  HPI  Bloody nasal discharge last few days.  Treated with amoxicillin last month.    NO fever    Ran out of astepro  And not using  No Known Allergies Past Medical History  Diagnosis Date  . 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 IBS (irritable bowel syndrome)     LACTOSE INTOLERANT  . Lactose intolerance   . Hyperlipemia   . Anxiety   . Thyroid nodule    Past Surgical History  Procedure Laterality Date  . Breast fibroadenoma surgery  05/2009    FIBROADENOMA RIGHT BREAST   History   Social History  . Marital Status: Married    Spouse Name: N/A    Number of Children: 3  . 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 Smoking status: Former Smoker    Quit date: 10/23/2003  . Smokeless tobacco: Never Used  . Alcohol Use: 7.0 oz/week    14 drink(s) per week     Comment: 2-3 glasses daily  . Drug Use: No  . Sexual Activity: Yes    Partners: Male    Birth Control/ Protection: Other-see comments, Surgical     Comment: vasectomy-husband   Other Topics Concern  . Not on file   Social History Narrative  . No narrative on file   Family History  Problem Relation Age of Onset  . Breast cancer Maternal Grandmother     early fifties  . COPD Father    Patient Active Problem List   Diagnosis Date Noted  . Multinodular goiter 03/28/2013  . Insomnia 10/22/2012  . Hyperlipidemia 10/22/2012  . Stress incontinence 10/22/2012  . Tonsillar cyst 10/22/2012  . Abnormal thyroid scan 10/22/2012  . Allergic rhinitis 10/22/2012  . Fibroadenoma of breast   . IBS (irritable bowel syndrome)    Current Outpatient Prescriptions on File Prior to Visit  Medication Sig Dispense Refill  . Azelastine HCl 0.15 % SOLN One spray daily fo 4 weeks then stop  30 mL  0  . Cranberry 1000 MG CAPS Take 1 capsule by mouth  daily.      . fexofenadine-pseudoephedrine (ALLEGRA-D) 60-120 MG per tablet Take 1 tablet by mouth daily.       Marland Kitchen OVER THE COUNTER MEDICATION 1 tablet daily. Yeast guard      . Probiotic Product (PROBIOTIC PO) Take by mouth.      Marland Kitchen albuterol (PROVENTIL HFA;VENTOLIN HFA) 108 (90 BASE) MCG/ACT inhaler Inhale 2 puffs into the lungs 4 (four) times daily.  1 Inhaler  0  . ALPRAZolam (XANAX) 0.25 MG tablet Take one tablet 3 times a week prn sleep  12 tablet  0  . chlorpheniramine-HYDROcodone (TUSSIONEX) 10-8 MG/5ML LQCR Take 5 mLs by mouth every 12 (twelve) hours as needed for cough.  140 mL  0  . hyoscyamine (LEVSIN/SL) 0.125 MG SL tablet Place 1 tablet (0.125 mg total) under the tongue every 4 (four) hours as needed for cramping.  60 tablet  2  . predniSONE (DELTASONE) 20 MG tablet Take 3 daily for 4 days, 2 daily for 4 days, 1 daily for 4 days.  24 tablet  0   No current facility-administered medications on file prior to visit.      Review of Systems See HPI  Objective:   Physical Exam Physical Exam  Constitutional: She is oriented to person, place, and time. She appears well-developed and well-nourished. She is cooperative.  HENT:  Head: Normocephalic and atraumatic.  Right Ear: A middle ear effusion is present.  Left Ear: A middle ear effusion is present.  Nose: Mucosal edema present. Right sinus exhibits maxillary sinus tenderness. Left sinus exhibits maxillary sinus tenderness.  Mouth/Throat: Posterior oropharyngeal erythema present.  Serous effusion bilaterally  Eyes: Conjunctivae and EOM are normal. Pupils are equal, round, and reactive to light.  Neck: Neck supple. Carotid bruit is not present. No mass present.  Cardiovascular: Regular rhythm, normal heart sounds, intact distal pulses and normal pulses. Exam reveals no gallop and no friction rub.  No murmur heard.  Pulmonary/Chest: Breath sounds normal. She has no wheezes. She has no rhonchi. She has no rales.  Neurological:  She is alert and oriented to person, place, and time.  Skin: Skin is warm and dry. No abrasion, no bruising, no ecchymosis and no rash noted. No cyanosis. Nails show no clubbing.  Psychiatric: She has a normal mood and affect. Her speech is normal and behavior is normal.             Assessment & Plan:  Sinusitis  Will give Z-pak  Peak flow 500  Allergic rhinitis  Ok for one bottle only of nasacort then can go back to [facility]    See me if not better

## 2013-06-19 ENCOUNTER — Encounter: Payer: 59 | Admitting: Internal Medicine

## 2013-10-25 ENCOUNTER — Encounter: Payer: Self-pay | Admitting: Gynecology

## 2013-10-25 ENCOUNTER — Other Ambulatory Visit (HOSPITAL_COMMUNITY)
Admission: RE | Admit: 2013-10-25 | Discharge: 2013-10-25 | Disposition: A | Discharge status: Home / Self Care | Payer: 59 | Source: Ambulatory Visit | Attending: Gynecology | Admitting: Gynecology

## 2013-10-25 ENCOUNTER — Ambulatory Visit (INDEPENDENT_AMBULATORY_CARE_PROVIDER_SITE_OTHER): Payer: 59 | Admitting: Gynecology

## 2013-10-25 VITALS — BP 110/70 | Ht 67.0 in | Wt 177.0 lb

## 2013-10-25 DIAGNOSIS — Z01419 Encounter for gynecological examination (general) (routine) without abnormal findings: Secondary | ICD-10-CM | POA: Insufficient documentation

## 2013-10-25 DIAGNOSIS — J0120 Acute ethmoidal sinusitis, unspecified: Secondary | ICD-10-CM

## 2013-10-25 DIAGNOSIS — Z1151 Encounter for screening for human papillomavirus (HPV): Secondary | ICD-10-CM | POA: Diagnosis present

## 2013-10-25 MED ORDER — AMOXICILLIN-POT CLAVULANATE 875-125 MG PO TABS: 1.0000 | ORAL_TABLET | Freq: Two times a day (BID) | ORAL | Status: DC

## 2013-10-25 NOTE — Addendum Note (Signed)
Addended by: Nelva Nay on: 10/25/2013 01:01 PM   Modules accepted: Orders

## 2013-10-25 NOTE — Progress Notes (Signed)
Michele Manning 30-Dec-1965 [id number]        48 y.o.  G3P3 for annual exam.  Several issues noted below.  Past medical history,surgical history, problem list, medications, allergies, family history and social history were all reviewed and documented as reviewed in the EPIC chart.  ROS:  12 system ROS performed with pertinent positives and negatives included in the history, assessment and plan.   Additional significant findings :  Sinusitis symptoms   Exam: Kim assistant Filed Vitals:   10/25/13 1220  BP: 110/70  Height: 5\' 7"  (1.702 m)  Weight: 177 lb (80.287 kg)   General appearance:  Normal affect, orientation and appearance. Skin: Grossly normal HEENT: Without gross lesions.  Bilateral periorbital tenderness to palpation. No erythema.  No cervical or supraclavicular adenopathy. Thyroid normal.  Lungs:  Clear without wheezing, rales or rhonchi Cardiac: RR, without RMG Abdominal:  Soft, nontender, without masses, guarding, rebound, organomegaly or hernia Breasts:  Examined lying and sitting without masses, retractions, discharge or axillary adenopathy. Pelvic:  Ext/BUS/vagina normal  Cervix normal. Pap/HPV  Uterus anteverted, normal size, shape and contour, midline and mobile nontender   Adnexa  Without masses or tenderness    Anus and perineum  Normal   Rectovaginal  Normal sphincter tone without palpated masses or tenderness.    Assessment/Plan:  48 y.o. G3P3 female for annual exam with regular menses, vasectomy birth control.   1. Sinusitis. Patient has been having congestion slight cough and worsening periorbital discomfort. Difficult to wear glasses because of the discomfort. Exam consistent with a sinusitis. Has been using OTC products to include decongestants and pushing fluids.  We'll treat with Augmentin 875 twice a day x7 days. OTC nasal steroid. Follow up if symptoms persistor worsen. 2. Pap smear 2012. Pap/HPV today. No history of abnormal Pap smears previously. Repeat in  3-5 year interval per current screening guidelines assuming this Pap smear is normal. 3. Mammography coming due in November I reminded her to schedule this. SBE monthly reviewed. 4. Colonoscopy was done 2012 and workup of irritable bowel type symptoms. Doing well subsequently. 5. Health maintenance. Patient's going to follow up with her primary and have her routine blood work done there at her request. Follow up in one year, sooner as needed.     Anastasio Auerbach MD, 12:52 PM 10/25/2013

## 2013-10-25 NOTE — Patient Instructions (Signed)
Take the antibiotic twice daily for 7 days. Follow up if your symptoms persist, worsen or recur.  You may obtain a copy of any labs that were done today by logging onto MyChart as outlined in the instructions provided with your AVS (after visit summary). The office will not call with normal lab results but certainly if there are any significant abnormalities then we will contact you.   Health Maintenance, Female A healthy lifestyle and preventative care can promote health and wellness.  Maintain regular health, dental, and eye exams.  Eat a healthy diet. Foods like vegetables, fruits, whole grains, low-fat dairy products, and lean protein foods contain the nutrients you need without too many calories. Decrease your intake of foods high in solid fats, added sugars, and salt. Get information about a proper diet from your caregiver, if necessary.  Regular physical exercise is one of the most important things you can do for your health. Most adults should get at least 150 minutes of moderate-intensity exercise (any activity that increases your heart rate and causes you to sweat) each week. In addition, most adults need muscle-strengthening exercises on 2 or more days a week.   Maintain a healthy weight. The body mass index (BMI) is a screening tool to identify possible weight problems. It provides an estimate of body fat based on height and weight. Your caregiver can help determine your BMI, and can help you achieve or maintain a healthy weight. For adults 20 years and older:  A BMI below 18.5 is considered underweight.  A BMI of 18.5 to 24.9 is normal.  A BMI of 25 to 29.9 is considered overweight.  A BMI of 30 and above is considered obese.  Maintain normal blood lipids and cholesterol by exercising and minimizing your intake of saturated fat. Eat a balanced diet with plenty of fruits and vegetables. Blood tests for lipids and cholesterol should begin at age 78 and be repeated every 5 years. If  your lipid or cholesterol levels are high, you are over 50, or you are a high risk for heart disease, you may need your cholesterol levels checked more frequently.Ongoing high lipid and cholesterol levels should be treated with medicines if diet and exercise are not effective.  If you smoke, find out from your caregiver how to quit. If you do not use tobacco, do not start.  Lung cancer screening is recommended for adults aged 67 80 years who are at high risk for developing lung cancer because of a history of smoking. Yearly low-dose computed tomography (CT) is recommended for people who have at least a 30-pack-year history of smoking and are a current smoker or have quit within the past 15 years. A pack year of smoking is smoking an average of 1 pack of cigarettes a day for 1 year (for example: 1 pack a day for 30 years or 2 packs a day for 15 years). Yearly screening should continue until the smoker has stopped smoking for at least 15 years. Yearly screening should also be stopped for people who develop a health problem that would prevent them from having lung cancer treatment.  If you are pregnant, do not drink alcohol. If you are breastfeeding, be very cautious about drinking alcohol. If you are not pregnant and choose to drink alcohol, do not exceed 1 drink per day. One drink is considered to be 12 ounces (355 mL) of beer, 5 ounces (148 mL) of wine, or 1.5 ounces (44 mL) of liquor.  Avoid use of street  drugs. Do not share needles with anyone. Ask for help if you need support or instructions about stopping the use of drugs.  High blood pressure causes heart disease and increases the risk of stroke. Blood pressure should be checked at least every 1 to 2 years. Ongoing high blood pressure should be treated with medicines, if weight loss and exercise are not effective.  If you are 73 to 46 years old, ask your caregiver if you should take aspirin to prevent strokes.  Diabetes screening involves taking  a blood sample to check your fasting blood sugar level. This should be done once every 3 years, after age 70, if you are within normal weight and without risk factors for diabetes. Testing should be considered at a younger age or be carried out more frequently if you are overweight and have at least 1 risk factor for diabetes.  Breast cancer screening is essential preventative care for women. You should practice "breast self-awareness." This means understanding the normal appearance and feel of your breasts and may include breast self-examination. Any changes detected, no matter how small, should be reported to a caregiver. Women in their 2s and 30s should have a clinical breast exam (CBE) by a caregiver as part of a regular health exam every 1 to 3 years. After age 65, women should have a CBE every year. Starting at age 59, women should consider having a mammogram (breast X-ray) every year. Women who have a family history of breast cancer should talk to their caregiver about genetic screening. Women at a high risk of breast cancer should talk to their caregiver about having an MRI and a mammogram every year.  Breast cancer gene (BRCA)-related cancer risk assessment is recommended for women who have family members with BRCA-related cancers. BRCA-related cancers include breast, ovarian, tubal, and peritoneal cancers. Having family members with these cancers may be associated with an increased risk for harmful changes (mutations) in the breast cancer genes BRCA1 and BRCA2. Results of the assessment will determine the need for genetic counseling and BRCA1 and BRCA2 testing.  The Pap test is a screening test for cervical cancer. Women should have a Pap test starting at age 36. Between ages 16 and 54, Pap tests should be repeated every 2 years. Beginning at age 41, you should have a Pap test every 3 years as long as the past 3 Pap tests have been normal. If you had a hysterectomy for a problem that was not cancer  or a condition that could lead to cancer, then you no longer need Pap tests. If you are between ages 16 and 50, and you have had normal Pap tests going back 10 years, you no longer need Pap tests. If you have had past treatment for cervical cancer or a condition that could lead to cancer, you need Pap tests and screening for cancer for at least 20 years after your treatment. If Pap tests have been discontinued, risk factors (such as a new sexual partner) need to be reassessed to determine if screening should be resumed. Some women have medical problems that increase the chance of getting cervical cancer. In these cases, your caregiver may recommend more frequent screening and Pap tests.  The human papillomavirus (HPV) test is an additional test that may be used for cervical cancer screening. The HPV test looks for the virus that can cause the cell changes on the cervix. The cells collected during the Pap test can be tested for HPV. The HPV test could be  used to screen women aged 59 years and older, and should be used in women of any age who have unclear Pap test results. After the age of 52, women should have HPV testing at the same frequency as a Pap test.  Colorectal cancer can be detected and often prevented. Most routine colorectal cancer screening begins at the age of 59 and continues through age 36. However, your caregiver may recommend screening at an earlier age if you have risk factors for colon cancer. On a yearly basis, your caregiver may provide home test kits to check for hidden blood in the stool. Use of a small camera at the end of a tube, to directly examine the colon (sigmoidoscopy or colonoscopy), can detect the earliest forms of colorectal cancer. Talk to your caregiver about this at age 65, when routine screening begins. Direct examination of the colon should be repeated every 5 to 10 years through age 103, unless early forms of pre-cancerous polyps or small growths are found.  Hepatitis C  blood testing is recommended for all people born from [birth year] through 1963 and any individual with known risks for hepatitis C.  Practice safe sex. Use condoms and avoid high-risk sexual practices to reduce the spread of sexually transmitted infections (STIs). Sexually active women aged 52 and younger should be checked for Chlamydia, which is a common sexually transmitted infection. Older women with new or multiple partners should also be tested for Chlamydia. Testing for other STIs is recommended if you are sexually active and at increased risk.  Osteoporosis is a disease in which the bones lose minerals and strength with aging. This can result in serious bone fractures. The risk of osteoporosis can be identified using a bone density scan. Women ages 22 and over and women at risk for fractures or osteoporosis should discuss screening with their caregivers. Ask your caregiver whether you should be taking a calcium supplement or vitamin D to reduce the rate of osteoporosis.  Menopause can be associated with physical symptoms and risks. Hormone replacement therapy is available to decrease symptoms and risks. You should talk to your caregiver about whether hormone replacement therapy is right for you.  Use sunscreen. Apply sunscreen liberally and repeatedly throughout the day. You should seek shade when your shadow is shorter than you. Protect yourself by wearing long sleeves, pants, a wide-brimmed hat, and sunglasses year round, whenever you are outdoors.  Notify your caregiver of new moles or changes in moles, especially if there is a change in shape or color. Also notify your caregiver if a mole is larger than the size of a pencil eraser.  Stay current with your immunizations. Document Released: 07/05/2010 Document Revised: 04/16/2012 Document Reviewed: 07/05/2010 [facility] Patient Information 2014 [place].

## 2013-10-26 LAB — URINALYSIS W MICROSCOPIC + REFLEX CULTURE
Bacteria, UA: NONE SEEN
Bilirubin Urine: NEGATIVE
Casts: NONE SEEN
Crystals: NONE SEEN
GLUCOSE, UA: NEGATIVE mg/dL
Hgb urine dipstick: NEGATIVE
Ketones, ur: NEGATIVE mg/dL
LEUKOCYTES UA: NEGATIVE
NITRITE: NEGATIVE
PROTEIN: NEGATIVE mg/dL
Specific Gravity, Urine: 1.018 (ref 1.005–1.030)
Squamous Epithelial / LPF: NONE SEEN
Urobilinogen, UA: 0.2 mg/dL (ref 0.0–1.0)
pH: 8.5 — ABNORMAL HIGH (ref 5.0–8.0)

## 2013-10-29 LAB — CYTOLOGY - PAP

## 2013-11-04 ENCOUNTER — Encounter: Payer: Self-pay | Admitting: Gynecology

## 2013-11-07 ENCOUNTER — Other Ambulatory Visit: Payer: Self-pay

## 2013-11-07 DIAGNOSIS — Z1231 Encounter for screening mammogram for malignant neoplasm of breast: Secondary | ICD-10-CM

## 2013-12-03 ENCOUNTER — Ambulatory Visit: Payer: 59

## 2013-12-04 ENCOUNTER — Other Ambulatory Visit: Payer: Self-pay

## 2013-12-04 ENCOUNTER — Ambulatory Visit
Admission: RE | Admit: 2013-12-04 | Discharge: 2013-12-04 | Disposition: A | Discharge status: Home / Self Care | Payer: 59 | Source: Ambulatory Visit

## 2013-12-04 ENCOUNTER — Other Ambulatory Visit: Payer: Self-pay | Admitting: *Deleted

## 2013-12-04 DIAGNOSIS — Z1231 Encounter for screening mammogram for malignant neoplasm of breast: Secondary | ICD-10-CM

## 2013-12-04 DIAGNOSIS — Z0000 Encounter for general adult medical examination without abnormal findings: Secondary | ICD-10-CM

## 2013-12-05 ENCOUNTER — Other Ambulatory Visit: Payer: Self-pay | Admitting: Gynecology

## 2013-12-05 ENCOUNTER — Encounter: Payer: Self-pay | Admitting: *Deleted

## 2013-12-05 ENCOUNTER — Encounter: Payer: Self-pay | Admitting: Internal Medicine

## 2013-12-05 ENCOUNTER — Ambulatory Visit (INDEPENDENT_AMBULATORY_CARE_PROVIDER_SITE_OTHER): Payer: 59 | Admitting: Internal Medicine

## 2013-12-05 VITALS — BP 113/73 | HR 103 | Temp 97.9°F | Resp 16 | Ht 67.0 in | Wt 178.0 lb

## 2013-12-05 DIAGNOSIS — Z23 Encounter for immunization: Secondary | ICD-10-CM

## 2013-12-05 DIAGNOSIS — Z0000 Encounter for general adult medical examination without abnormal findings: Secondary | ICD-10-CM

## 2013-12-05 DIAGNOSIS — Z0189 Encounter for other specified special examinations: Secondary | ICD-10-CM

## 2013-12-05 DIAGNOSIS — Z1211 Encounter for screening for malignant neoplasm of colon: Secondary | ICD-10-CM

## 2013-12-05 DIAGNOSIS — R928 Other abnormal and inconclusive findings on diagnostic imaging of breast: Secondary | ICD-10-CM

## 2013-12-05 LAB — HEMOCCULT GUIAC POC 1CARD (OFFICE): FECAL OCCULT BLD: NEGATIVE

## 2013-12-05 LAB — POCT URINALYSIS DIPSTICK
BILIRUBIN UA: NEGATIVE
GLUCOSE UA: NEGATIVE
Ketones, UA: NEGATIVE
LEUKOCYTES UA: NEGATIVE
NITRITE UA: NEGATIVE
PH UA: 6.5
Protein, UA: NEGATIVE
RBC UA: NEGATIVE
Spec Grav, UA: 1.01
UROBILINOGEN UA: NEGATIVE

## 2013-12-05 MED ORDER — NYSTATIN-TRIAMCINOLONE 100000-0.1 UNIT/GM-% EX OINT: 1.0000 "application " | TOPICAL_OINTMENT | Freq: Two times a day (BID) | CUTANEOUS | Status: DC

## 2013-12-05 NOTE — Patient Instructions (Signed)
See me as needed 

## 2013-12-05 NOTE — Progress Notes (Signed)
Subjective:    Patient ID: Michele Manning, female    DOB: 18-Nov-1965, 48 y.o.   MRN: [id number]  HPI  10/25/2013 GYN note Assessment/Plan: 48 y.o. G3P3 female for annual exam with regular menses, vasectomy birth control.   1. Sinusitis. Patient has been having congestion slight cough and worsening periorbital discomfort. Difficult to wear glasses because of the discomfort. Exam consistent with a sinusitis. Has been using OTC products to include decongestants and pushing fluids. We'll treat with Augmentin 875 twice a day x7 days. OTC nasal steroid. Follow up if symptoms persistor worsen. 2. Pap smear 2012. Pap/HPV today. No history of abnormal Pap smears previously. Repeat in 3-5 year interval per current screening guidelines assuming this Pap smear is normal. 3. Mammography coming due in November I reminded her to schedule this. SBE monthly reviewed. 4. Colonoscopy was done 2012 and workup of irritable bowel type symptoms. Doing well subsequently. 5. Health maintenance. Patient's going to follow up with her primary and have her routine blood work done there at her request. Follow up in one year, sooner as needed.   Today:  Michele Manning is here for CPE  HM:  Pap done GYN,  Mm done yesterday ,  Needs TDap,  Had colonoscopy 2012 repeat 2022,  Less than 30 pack year smoker.  Smoked less than 1/2 ppd  for 15 years  Had diarrhea after recent Augmentin for sinusitis .  Some rectal irritation.  No blood in stool  MNG  Benign thyroid bx 03/2013  See mm pt does not palpate any mass  Review of Systems  Respiratory: Negative for cough, chest tightness, shortness of breath and wheezing.   Cardiovascular: Negative for chest pain, palpitations and leg swelling.  All other systems reviewed and are negative.      Objective:   Physical Exam Physical Exam  Nursing note and vitals reviewed.  Constitutional: She is oriented to person, place, and time. She appears well-developed and well-nourished.    HENT:  Head: Normocephalic and atraumatic.  Right Ear: Tympanic membrane and ear canal normal. No drainage. Tympanic membrane is not injected and not erythematous.  Left Ear: Tympanic membrane and ear canal normal. No drainage. Tympanic membrane is not injected and not erythematous.  Nose: Nose normal. Right sinus exhibits no maxillary sinus tenderness and no frontal sinus tenderness. Left sinus exhibits no maxillary sinus tenderness and no frontal sinus tenderness.  Mouth/Throat: Oropharynx is clear and moist. No oral lesions. No oropharyngeal exudate.  Eyes: Conjunctivae and EOM are normal. Pupils are equal, round, and reactive to light.  Neck: Normal range of motion. Neck supple. No JVD present. Carotid bruit is not present. No mass and no thyromegaly present.  Cardiovascular: Normal rate, regular rhythm, S1 normal, S2 normal and intact distal pulses. Exam reveals no gallop and no friction rub.  No murmur heard.  Pulses:  Carotid pulses are 2+ on the right side, and 2+ on the left side.  Dorsalis pedis pulses are 2+ on the right side, and 2+ on the left side.  No carotid bruit. No LE edema  Pulmonary/Chest: Breath sounds normal. She has no wheezes. She has no rales. She exhibits no tenderness. Breast  Careful exam of both breasts reveals no discrete mass no nipple discharge no axillary adenpathy bilaterally Abdominal: Soft. Bowel sounds are normal. She exhibits no distension and no mass. There is no hepatosplenomegaly. There is no tenderness. There is no CVA tenderness. Rectal redness around anus  Guaiac neg Musculoskeletal: Normal range of motion.  No active synovitis to joints.  Lymphadenopathy:  She has no cervical adenopathy.  She has no axillary adenopathy.  Right: No inguinal and no supraclavicular adenopathy present.  Left: No inguinal and no supraclavicular adenopathy present.  Neurological: She is alert and oriented to person, place, and time. She has normal strength and normal  reflexes. She displays no tremor. No cranial nerve deficit or sensory deficit. Coordination and gait normal.  Skin: Skin is warm and dry. No rash noted. No cyanosis. Nails show no clubbing.  Psychiatric: She has a normal mood and affect. Her speech is normal and behavior is normal. Cognition and memory are normal.           Assessment & Plan:  HM see scanned sheet  TDap today  counsleed lung CA screening guidleline  Rare tobacco use advised cessation  MNG  Will follow with endocrine  Anal irritation  Of for Mycolog for 2 weeks  Abnormal mm  Pt has history of fibroadenoma   She will follow with radiology for U/S   hyeprlipidemia  Will recheck today  DASH diet given

## 2013-12-07 LAB — CBC WITH DIFFERENTIAL/PLATELET
Basophils Absolute: 0.1 10*3/uL (ref 0.0–0.1)
Basophils Relative: 2 % — ABNORMAL HIGH (ref 0–1)
Eosinophils Absolute: 0.1 10*3/uL (ref 0.0–0.7)
Eosinophils Relative: 1 % (ref 0–5)
HEMATOCRIT: 41.5 % (ref 36.0–46.0)
HEMOGLOBIN: 14.2 g/dL (ref 12.0–15.0)
LYMPHS ABS: 3.5 10*3/uL (ref 0.7–4.0)
LYMPHS PCT: 51 % — AB (ref 12–46)
MCH: 28.8 pg (ref 26.0–34.0)
MCHC: 34.2 g/dL (ref 30.0–36.0)
MCV: 84.2 fL (ref 78.0–100.0)
MONO ABS: 0.4 10*3/uL (ref 0.1–1.0)
MONOS PCT: 6 % (ref 3–12)
MPV: 9.9 fL (ref 9.4–12.4)
NEUTROS ABS: 2.8 10*3/uL (ref 1.7–7.7)
NEUTROS PCT: 40 % — AB (ref 43–77)
Platelets: 315 10*3/uL (ref 150–400)
RBC: 4.93 MIL/uL (ref 3.87–5.11)
RDW: 14.5 % (ref 11.5–15.5)
WBC: 6.9 10*3/uL (ref 4.0–10.5)

## 2013-12-07 LAB — LIPID PANEL
CHOL/HDL RATIO: 3.9 ratio
Cholesterol: 235 mg/dL — ABNORMAL HIGH (ref 0–200)
HDL: 61 mg/dL (ref >39)
LDL Cholesterol: 145 mg/dL — ABNORMAL HIGH (ref 0–99)
Triglycerides: 147 mg/dL (ref <150)
VLDL: 29 mg/dL (ref 0–40)

## 2013-12-07 LAB — COMPLETE METABOLIC PANEL WITH GFR
ALBUMIN: 4.1 g/dL (ref 3.5–5.2)
ALK PHOS: 45 U/L (ref 39–117)
ALT: 61 U/L — ABNORMAL HIGH (ref 0–35)
AST: 32 U/L (ref 0–37)
BUN: 10 mg/dL (ref 6–23)
CO2: 19 meq/L (ref 19–32)
Calcium: 10 mg/dL (ref 8.4–10.5)
Chloride: 97 mEq/L (ref 96–112)
Creat: 0.72 mg/dL (ref 0.50–1.10)
GFR, Est African American: >89 mL/min
GFR, Est Non African American: >89 mL/min
Glucose, Bld: 112 mg/dL — ABNORMAL HIGH (ref 70–99)
POTASSIUM: 4.5 meq/L (ref 3.5–5.3)
SODIUM: 135 meq/L (ref 135–145)
TOTAL PROTEIN: 6.5 g/dL (ref 6.0–8.3)
Total Bilirubin: 0.6 mg/dL (ref 0.2–1.2)

## 2013-12-07 LAB — TSH: TSH: 2.421 u[IU]/mL (ref 0.350–4.500)

## 2013-12-07 LAB — VITAMIN D 25 HYDROXY (VIT D DEFICIENCY, FRACTURES): Vit D, 25-Hydroxy: 17 ng/mL — ABNORMAL LOW (ref 30–100)

## 2013-12-09 ENCOUNTER — Ambulatory Visit
Admission: RE | Admit: 2013-12-09 | Discharge: 2013-12-09 | Disposition: A | Discharge status: Home / Self Care | Payer: 59 | Source: Ambulatory Visit | Attending: Gynecology | Admitting: Gynecology

## 2013-12-09 ENCOUNTER — Other Ambulatory Visit: Payer: Self-pay | Admitting: Gynecology

## 2013-12-09 DIAGNOSIS — N6310 Unspecified lump in the right breast, unspecified quadrant: Secondary | ICD-10-CM

## 2013-12-09 DIAGNOSIS — R928 Other abnormal and inconclusive findings on diagnostic imaging of breast: Secondary | ICD-10-CM

## 2013-12-12 ENCOUNTER — Telehealth: Payer: Self-pay | Admitting: *Deleted

## 2013-12-12 ENCOUNTER — Other Ambulatory Visit: Payer: 59

## 2013-12-12 NOTE — Telephone Encounter (Signed)
Pt called back and said she will keep schedule appointment at breast center

## 2013-12-12 NOTE — Telephone Encounter (Signed)
Pt has breast bx scheduled on 12/19/13 at breast center asked if sooner appointment could be made there. I called breast center to see if they had sooner appointment and they do not. I left on pt voicemail they don't and to call me to see if she would like for me to try [facility], but films from breast center will need to be sent to [facility] before appointment. I will wait to here from patient.

## 2013-12-16 ENCOUNTER — Other Ambulatory Visit: Payer: Self-pay | Admitting: Gynecology

## 2013-12-16 ENCOUNTER — Other Ambulatory Visit: Payer: Self-pay | Admitting: Internal Medicine

## 2013-12-16 DIAGNOSIS — E559 Vitamin D deficiency, unspecified: Secondary | ICD-10-CM | POA: Insufficient documentation

## 2013-12-16 DIAGNOSIS — N6310 Unspecified lump in the right breast, unspecified quadrant: Secondary | ICD-10-CM

## 2013-12-16 MED ORDER — VITAMIN D (ERGOCALCIFEROL) 1.25 MG (50000 UNIT) PO CAPS: ORAL_CAPSULE | ORAL | Status: DC

## 2013-12-18 ENCOUNTER — Telehealth: Payer: Self-pay

## 2013-12-18 MED ORDER — ALPRAZOLAM ER 0.5 MG PO TB24: ORAL_TABLET | ORAL | Status: DC

## 2013-12-18 NOTE — Telephone Encounter (Signed)
Michele Manning [phone] [facility]  Susen called and she is having a biopsy on her breast tomorrow and she is wondering if you could call something in for her.

## 2013-12-19 ENCOUNTER — Ambulatory Visit
Admission: RE | Admit: 2013-12-19 | Discharge: 2013-12-19 | Disposition: A | Discharge status: Home / Self Care | Payer: 59 | Source: Ambulatory Visit | Attending: Gynecology | Admitting: Gynecology

## 2013-12-19 ENCOUNTER — Other Ambulatory Visit: Payer: 59

## 2013-12-19 ENCOUNTER — Telehealth: Payer: Self-pay | Admitting: *Deleted

## 2013-12-19 DIAGNOSIS — N6310 Unspecified lump in the right breast, unspecified quadrant: Secondary | ICD-10-CM

## 2013-12-19 NOTE — Telephone Encounter (Signed)
I left a message with Michele Manning on 12/17/2013 to call back for results. I also mailed a copy to her. -eh

## 2013-12-19 NOTE — Telephone Encounter (Signed)
-----   Message from Lanice Shirts, MD sent at 12/16/2013  7:34 AM EST ----- Michele Manning  Call pt and let her know that her thyroid blood test is good - be sure to keep her follow up with Dr. Cruzita Lederer  One of her liver tests are slightly high.   Give her an appt to see me in office to recheck this in January - route back with her appt date  Her vitamin D level is low I will e-scribe 50,000 units of vitamin D3 to her pharmacy take once a week for 12 weeks then take 1000 units OTC daily   I will put labs on your desk to mail    thanks

## 2013-12-20 ENCOUNTER — Encounter: Payer: Self-pay | Admitting: Gynecology

## 2014-01-23 ENCOUNTER — Ambulatory Visit (INDEPENDENT_AMBULATORY_CARE_PROVIDER_SITE_OTHER): Payer: 59 | Admitting: Internal Medicine

## 2014-01-23 ENCOUNTER — Encounter: Payer: Self-pay | Admitting: Internal Medicine

## 2014-01-23 ENCOUNTER — Other Ambulatory Visit: Payer: Self-pay | Admitting: Internal Medicine

## 2014-01-23 VITALS — BP 118/74 | HR 95 | Resp 16 | Ht 67.0 in | Wt 179.0 lb

## 2014-01-23 DIAGNOSIS — D241 Benign neoplasm of right breast: Secondary | ICD-10-CM | POA: Diagnosis not present

## 2014-01-23 DIAGNOSIS — E785 Hyperlipidemia, unspecified: Secondary | ICD-10-CM | POA: Diagnosis not present

## 2014-01-23 DIAGNOSIS — R740 Nonspecific elevation of levels of transaminase and lactic acid dehydrogenase [LDH]: Secondary | ICD-10-CM

## 2014-01-23 DIAGNOSIS — E042 Nontoxic multinodular goiter: Secondary | ICD-10-CM

## 2014-01-23 DIAGNOSIS — R7401 Elevation of levels of liver transaminase levels: Secondary | ICD-10-CM

## 2014-01-23 DIAGNOSIS — E559 Vitamin D deficiency, unspecified: Secondary | ICD-10-CM | POA: Diagnosis not present

## 2014-01-23 LAB — LIPID PANEL
CHOLESTEROL: 249 mg/dL — AB (ref 0–200)
HDL: 56 mg/dL (ref >39)
LDL CALC: 165 mg/dL — AB (ref 0–99)
Total CHOL/HDL Ratio: 4.4 Ratio
Triglycerides: 139 mg/dL (ref <150)
VLDL: 28 mg/dL (ref 0–40)

## 2014-01-23 LAB — COMPLETE METABOLIC PANEL WITH GFR
ALT: 70 U/L — AB (ref 0–35)
AST: 30 U/L (ref 0–37)
Albumin: 4.4 g/dL (ref 3.5–5.2)
Alkaline Phosphatase: 51 U/L (ref 39–117)
BILIRUBIN TOTAL: 0.5 mg/dL (ref 0.2–1.2)
BUN: 12 mg/dL (ref 6–23)
CALCIUM: 9.5 mg/dL (ref 8.4–10.5)
CO2: 24 mEq/L (ref 19–32)
CREATININE: 0.69 mg/dL (ref 0.50–1.10)
Chloride: 101 mEq/L (ref 96–112)
Glucose, Bld: 104 mg/dL — ABNORMAL HIGH (ref 70–99)
Potassium: 4.5 mEq/L (ref 3.5–5.3)
SODIUM: 136 meq/L (ref 135–145)
TOTAL PROTEIN: 6.8 g/dL (ref 6.0–8.3)

## 2014-01-23 NOTE — Patient Instructions (Signed)
To lab today   See me as needed

## 2014-01-23 NOTE — Progress Notes (Signed)
Subjective:    Patient ID: Michele Manning, female    DOB: 01-05-1965, 49 y.o.   MRN: [id number]  HPI  12/2013 note HM see scanned sheet TDap today counsleed lung CA screening guidleline  Rare tobacco use advised cessation  MNG Will follow with endocrine  Anal irritation Of for Mycolog for 2 weeks  Abnormal mm Pt has history of fibroadenoma She will follow with radiology for U/S   hyeprlipidemia Will recheck today DASH diet given   TODAY   [name] is here to follow up on isoslated elevation of ALT of 61.    She tells me she drinks 2 glasses of red wine most nights.  No exposure to hepatitis  Brother died of hepatitis contracted when he was an Forensic scientist   See lipids and vitamin 'd  Breast fibroadenoma  She had bx done 12.2015 right breast which showed fibrodadenoma   MNG  bx done 03/2013 benign nodule   No Known Allergies Past Medical History  Diagnosis Date  . IBS (irritable bowel syndrome)     LACTOSE INTOLERANT  . Lactose intolerance   . Hyperlipemia   . Anxiety   . Thyroid nodule   . Allergy   . 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 Breast fibroadenoma surgery  05/2009    FIBROADENOMA RIGHT BREAST   History   Social History  . Marital Status: Married    Spouse Name: N/A    Number of Children: 3  . 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 Smoking status: Former Smoker    Quit date: 10/23/2003  . Smokeless tobacco: Never Used  . Alcohol Use: 7.0 oz/week    14 Not specified per week     Comment: 2-3 glasses daily  . Drug Use: No  . Sexual Activity:    Partners: Male    Birth Control/ Protection: Other-see comments, Surgical     Comment: vasectomy-husband   Other Topics Concern  . Not on file   Social History Narrative   Family History  Problem Relation Age of Onset  . Breast cancer Maternal Grandmother     early  fifties  . COPD Father   . Heart disease Father    Patient Active Problem List   Diagnosis Date Noted  . Vitamin D deficiency 12/16/2013  . Multinodular goiter 03/28/2013  . Insomnia 10/22/2012  . Hyperlipidemia 10/22/2012  . Stress incontinence 10/22/2012  . Tonsillar cyst 10/22/2012  . Abnormal thyroid scan 10/22/2012  . Allergic rhinitis 10/22/2012  . Fibroadenoma of breast   . IBS (irritable bowel syndrome)    Current Outpatient Prescriptions on File Prior to Visit  Medication Sig Dispense Refill  . ALPRAZolam (XANAX XR) 0.5 MG 24 hr tablet Take one tablet 30 mins prior to procedure 1 tablet 0  . Azelastine HCl 0.15 % SOLN One spray daily fo 4 weeks then stop 30 mL 0  . Clindamycin-Benzoyl Per, Refr, gel   0  . Cranberry 1000 MG CAPS Take 1 capsule by mouth daily.    . fexofenadine-pseudoephedrine (ALLEGRA-D) 60-120 MG per tablet Take 1 tablet by mouth daily.     Marland Kitchen nystatin-triamcinolone ointment (MYCOLOG) Apply 1 application topically 2 (two) times daily. Apply to rectal area after a BM for 2 weeks then stop 30 g 0  . OVER THE COUNTER MEDICATION 1 tablet daily. Yeast  guard    . Probiotic Product (PROBIOTIC PO) Take by mouth.    . Vitamin D, Ergocalciferol, (DRISDOL) 50000 UNITS CAPS capsule Take once a week for 12 weeks 12 capsule 0   No current facility-administered medications on file prior to visit.      Review of Systems See HPI    Objective:   Physical Exam Physical Exam  Nursing note and vitals reviewed.  Constitutional: She is oriented to person, place, and time. She appears well-developed and well-nourished.  HENT:  Head: Normocephalic and atraumatic.  Cardiovascular: Normal rate and regular rhythm. Exam reveals no gallop and no friction rub.  No murmur heard.  Pulmonary/Chest: Breath sounds normal. She has no wheezes. She has no rales.  Neurological: She is alert and oriented to person, place, and time.  Skin: Skin is warm and dry.  Psychiatric: She has  a normal mood and affect. Her behavior is normal.         Assessment & Plan:  Elevated alt  Will recheck today  furhter management based on results.  ADvised to limit ETOH to 1-2 glasses and not nightly   Vitamin D deficiency  Will recheck today  Hyperlipidemia  Will check today   MNG   BX benign 03/2013  Fibroadenoma  R breast

## 2014-01-24 LAB — VITAMIN D 25 HYDROXY (VIT D DEFICIENCY, FRACTURES): Vit D, 25-Hydroxy: 22 ng/mL — ABNORMAL LOW (ref 30–100)

## 2014-01-27 LAB — HEPATITIS PANEL, ACUTE
HCV AB: NEGATIVE
Hep A IgM: NONREACTIVE
Hep B C IgM: NONREACTIVE
Hepatitis B Surface Ag: NEGATIVE

## 2014-01-27 NOTE — Progress Notes (Signed)
Called the lab and added the additional labs -eh

## 2014-01-28 ENCOUNTER — Telehealth: Payer: Self-pay | Admitting: Internal Medicine

## 2014-01-28 DIAGNOSIS — R7989 Other specified abnormal findings of blood chemistry: Secondary | ICD-10-CM

## 2014-01-28 DIAGNOSIS — R945 Abnormal results of liver function studies: Principal | ICD-10-CM

## 2014-01-28 LAB — ANA: Anti Nuclear Antibody(ANA): NEGATIVE

## 2014-01-28 NOTE — Telephone Encounter (Signed)
Spoke with pt and informed of liver and ldl blood tests  Hepatitis neg ANA neg  Will get liver ultrasound.   Pt advised to see me in 3-4 weeks. Stop drinking any wine or ETOH until then

## 2014-01-28 NOTE — Telephone Encounter (Signed)
I spoke Michele Manning and gave her the U/S appointment. I also set her up for a follow in 3 weeks. -eh

## 2014-01-31 ENCOUNTER — Ambulatory Visit (HOSPITAL_BASED_OUTPATIENT_CLINIC_OR_DEPARTMENT_OTHER)
Admission: RE | Admit: 2014-01-31 | Discharge: 2014-01-31 | Disposition: A | Discharge status: Home / Self Care | Payer: 59 | Source: Ambulatory Visit | Attending: Internal Medicine | Admitting: Internal Medicine

## 2014-01-31 DIAGNOSIS — R7989 Other specified abnormal findings of blood chemistry: Secondary | ICD-10-CM

## 2014-01-31 DIAGNOSIS — K760 Fatty (change of) liver, not elsewhere classified: Secondary | ICD-10-CM | POA: Diagnosis not present

## 2014-01-31 DIAGNOSIS — R945 Abnormal results of liver function studies: Secondary | ICD-10-CM | POA: Diagnosis present

## 2014-02-02 ENCOUNTER — Telehealth: Payer: Self-pay | Admitting: Internal Medicine

## 2014-02-02 DIAGNOSIS — K760 Fatty (change of) liver, not elsewhere classified: Secondary | ICD-10-CM | POA: Insufficient documentation

## 2014-02-02 NOTE — Telephone Encounter (Signed)
Spoke with pt and given U/S results   Advised to stop drinking any wine and keep appt with me later this month

## 2014-02-18 ENCOUNTER — Other Ambulatory Visit: Payer: Self-pay | Admitting: *Deleted

## 2014-02-18 DIAGNOSIS — R748 Abnormal levels of other serum enzymes: Secondary | ICD-10-CM

## 2014-02-19 LAB — COMPLETE METABOLIC PANEL WITH GFR
ALT: 49 U/L — AB (ref 0–35)
AST: 24 U/L (ref 0–37)
Albumin: 4 g/dL (ref 3.5–5.2)
Alkaline Phosphatase: 42 U/L (ref 39–117)
BUN: 7 mg/dL (ref 6–23)
CHLORIDE: 103 meq/L (ref 96–112)
CO2: 25 mEq/L (ref 19–32)
Calcium: 9.3 mg/dL (ref 8.4–10.5)
Creat: 0.66 mg/dL (ref 0.50–1.10)
GFR, Est Non African American: >89 mL/min
Glucose, Bld: 105 mg/dL — ABNORMAL HIGH (ref 70–99)
Potassium: 4.3 mEq/L (ref 3.5–5.3)
Sodium: 136 mEq/L (ref 135–145)
TOTAL PROTEIN: 5.9 g/dL — AB (ref 6.0–8.3)
Total Bilirubin: 0.7 mg/dL (ref 0.2–1.2)

## 2014-02-20 ENCOUNTER — Encounter: Payer: Self-pay | Admitting: Internal Medicine

## 2014-02-20 ENCOUNTER — Ambulatory Visit (INDEPENDENT_AMBULATORY_CARE_PROVIDER_SITE_OTHER): Payer: 59 | Admitting: Internal Medicine

## 2014-02-20 VITALS — BP 122/65 | HR 105 | Resp 16 | Ht 67.0 in | Wt 170.0 lb

## 2014-02-20 DIAGNOSIS — R945 Abnormal results of liver function studies: Secondary | ICD-10-CM

## 2014-02-20 DIAGNOSIS — K760 Fatty (change of) liver, not elsewhere classified: Secondary | ICD-10-CM

## 2014-02-20 DIAGNOSIS — E785 Hyperlipidemia, unspecified: Secondary | ICD-10-CM

## 2014-02-20 DIAGNOSIS — R7989 Other specified abnormal findings of blood chemistry: Secondary | ICD-10-CM

## 2014-02-20 NOTE — Progress Notes (Signed)
Subjective:    Patient ID: Michele Manning, female    DOB: 1965/06/22, 49 y.o.   MRN: [id number]  HPI 1/21/ note Assessment & Plan:  Elevated alt Will recheck today furhter management based on results. ADvised to limit ETOH to 1-2 glasses and not nightly   Vitamin D deficiency Will recheck today  Hyperlipidemia Will check today   MNG BX benign 03/2013  Fibroadenoma R breast       TODAY  Michele Manning is here for follow up of elevated lfts   She tells me she has stopped drinking wine with dinner.   See liver U/S - hepatic steatosis  No mass lesion.   Hepatitis and ANA neg  Hyperlipidemia  I am hesitant to initiate statin at this point  No Known Allergies Past Medical History  Diagnosis Date  . IBS (irritable bowel syndrome)     LACTOSE INTOLERANT  . Lactose intolerance   . Hyperlipemia   . Anxiety   . Thyroid nodule   . Allergy   . 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 Breast fibroadenoma surgery  05/2009    FIBROADENOMA RIGHT BREAST  . Breast biopsy Right 12/19/2013   History   Social History  . Marital Status: Married    Spouse Name: N/A  . Number of Children: 3  . 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 Smoking status: Former Smoker    Quit date: 10/23/2003  . Smokeless tobacco: Never Used  . Alcohol Use: 7.0 oz/week    14 Standard drinks or equivalent per week     Comment: 2-3 glasses daily  . Drug Use: No  . Sexual Activity:    Partners: Male    Birth Control/ Protection: Other-see comments, Surgical     Comment: vasectomy-husband   Other Topics Concern  . Not on file   Social History Narrative   Family History  Problem Relation Age of Onset  . Breast cancer Maternal Grandmother     early fifties  . COPD Father   . Heart disease Father    Patient Active Problem List   Diagnosis Date Noted  . Hepatic steatosis  See U/S  01/2014 02/02/2014  . Fibroadenoma of right breast 01/23/2014  . Elevated ALT measurement 01/23/2014  . Vitamin D deficiency 12/16/2013  . Multinodular goiter 03/28/2013  . Insomnia 10/22/2012  . Hyperlipidemia 10/22/2012  . Stress incontinence 10/22/2012  . Tonsillar cyst 10/22/2012  . Abnormal thyroid scan 10/22/2012  . Allergic rhinitis 10/22/2012  . Fibroadenoma of breast   . IBS (irritable bowel syndrome)    Current Outpatient Prescriptions on File Prior to Visit  Medication Sig Dispense Refill  . ALPRAZolam (XANAX XR) 0.5 MG 24 hr tablet Take one tablet 30 mins prior to procedure 1 tablet 0  . Clindamycin-Benzoyl Per, Refr, gel   0  . Cranberry 1000 MG CAPS Take 1 capsule by mouth daily.    . fexofenadine-pseudoephedrine (ALLEGRA-D) 60-120 MG per tablet Take 1 tablet by mouth daily.     Marland Kitchen OVER THE COUNTER MEDICATION 1 tablet daily. Yeast guard    . Probiotic Product (PROBIOTIC PO) Take by mouth.    . Vitamin D, Ergocalciferol, (DRISDOL) 50000 UNITS CAPS capsule Take once a week for 12 weeks 12 capsule 0   No current facility-administered medications on file prior to visit.  Review of Systems See HPI    Objective:   Physical Exam Physical Exam  Nursing note and vitals reviewed.  Constitutional: She is oriented to person, place, and time. She appears well-developed and well-nourished.  HENT:  Head: Normocephalic and atraumatic.  Cardiovascular: Normal rate and regular rhythm. Exam reveals no gallop and no friction rub.  No murmur heard.  Pulmonary/Chest: Breath sounds normal. She has no wheezes. She has no rales.  Neurological: She is alert and oriented to person, place, and time.  Skin: Skin is warm and dry.  Psychiatric: She has a normal mood and affect. Her behavior is normal.              Assessment & Plan:  Elevated transaminase/hepatic steatosis  :  I advised avoidance of all ETOH for now,  Weight loss.  Will refer to GI -  Pt has seen Dr.  Sharlett Iles in the past and states she wishes to make her own appt  Hyperlipidemia  Will recheck levels in 4-6 weeks.  She may need cardiology / lipid clinic referral given steatosis  She voices understanding

## 2014-03-17 ENCOUNTER — Encounter: Payer: Self-pay | Admitting: Internal Medicine

## 2014-03-17 ENCOUNTER — Ambulatory Visit (INDEPENDENT_AMBULATORY_CARE_PROVIDER_SITE_OTHER): Payer: 59 | Admitting: Internal Medicine

## 2014-03-17 VITALS — BP 118/76 | HR 86 | Temp 98.5°F | Resp 12 | Wt 162.0 lb

## 2014-03-17 DIAGNOSIS — E042 Nontoxic multinodular goiter: Secondary | ICD-10-CM

## 2014-03-17 NOTE — Patient Instructions (Signed)
Please return in 1 year.  Please let me know if you develop: - problems swallowing - choking - shortness of breath with lying down

## 2014-03-17 NOTE — Progress Notes (Signed)
Patient ID: ROSANN Manning, female   DOB: 1965/03/07, 49 y.o.   MRN: [id number]   HPI  Michele Manning is a 49 y.o.-year-old female, returning for f/u for MNG. Last visit 1 year ago.  Reviewed hx: She had a cyst on her tonsils >> investigated by a CT scan in 09/2012 >> incidentally found a nodule >> thyroid ultrasound (01/2013): several small nodules, largest lesion 10 x 9 x 9 cm with coarse calcification, mid lobe, corresponding to CT finding  FNA of the R thyroid nodule (03/2013): benign.  I reviewed pt's thyroid tests: Lab Results  Component Value Date   TSH 2.421 12/05/2013   TSH 2.330 10/22/2012    Pt denies feeling nodules in neck, hoarseness, dysphagia/odynophagia, SOB with lying down.  Pt denies: - heat intolerance/cold intolerance - tremors - palpitations - anxiety/depression - hyperdefecation/constipation - weight loss - weight gain - dry skin - hair falling - problems with concentration - fatigue  Pt does have a FH of thyroid ds - in mother Michele Manning Ds.).   She also has a history of HL, IBS - much better.   ROS: Constitutional: no weight gain/loss, no fatigue, no subjective hyperthermia/hypothermia Eyes: no blurry vision, no xerophthalmia ENT: no sore throat, no nodules palpated in throat, no dysphagia/odynophagia, no hoarseness Cardiovascular: no CP/SOB/palpitations/leg swelling Respiratory: no cough/SOB Gastrointestinal: no N/V/D/C Musculoskeletal: no muscle/joint aches Skin: no rashes Neurological: no tremors/numbness/tingling/dizziness  I reviewed pt's medications, allergies, PMH, social hx, family hx, and changes were documented in the history of present illness. Otherwise, unchanged from my initial visit note.  Past Medical History  Diagnosis Date  . IBS (irritable bowel syndrome)     LACTOSE INTOLERANT  . Lactose intolerance   . Hyperlipemia   . Anxiety   . Thyroid nodule   . Allergy   . 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 Fatty infiltration of  liver    Past Surgical History  Procedure Laterality Date  . Breast fibroadenoma surgery  05/2009    FIBROADENOMA RIGHT BREAST  . Breast biopsy Right 12/19/2013   History   Social History  . 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 Smoking status: Former Smoker    Quit date: 10/23/2003  . Smokeless tobacco: Never Used  . Alcohol Use: 7.0 oz/week    14 drink(s) per week     Comment: 2-3 glasses daily  . Drug Use: No  . Sexual Activity: Yes    Partners: Male    Birth Control/ Protection: Other-see comments, Surgical     Comment: vasectomy-husband   Current Outpatient Prescriptions on File Prior to Visit  Medication Sig Dispense Refill  . Clindamycin-Benzoyl Per, Refr, gel   0  . Cranberry 1000 MG CAPS Take 1 capsule by mouth daily.    . fexofenadine-pseudoephedrine (ALLEGRA-D) 60-120 MG per tablet Take 1 tablet by mouth daily.     Marland Kitchen OVER THE COUNTER MEDICATION 1 tablet daily. Yeast guard    . Probiotic Product (PROBIOTIC PO) Take by mouth.    . Vitamin D, Ergocalciferol, (DRISDOL) 50000 UNITS CAPS capsule Take once a week for 12 weeks 12 capsule 0   No current facility-administered medications on file prior to visit.   No Known Allergies Family History  Problem Relation Age of Onset  . Breast cancer Maternal Grandmother     early  fifties  . COPD Father   . Heart disease Father    PE: BP 118/76 mmHg  Pulse 86  Temp(Src) 98.5 F (36.9 C) (Oral)  Resp 12  Wt 162 lb (73.483 kg)  SpO2 97% Body mass index is 25.37 kg/(m^2).  Wt Readings from Last 3 Encounters:  03/17/14 162 lb (73.483 kg)  02/20/14 170 lb (77.111 kg)  01/23/14 179 lb (81.194 kg)   Constitutional: overweight, in NAD Eyes: PERRLA, EOMI, no exophthalmos ENT: moist mucous membranes, + thyromegaly, no cervical lymphadenopathy Cardiovascular: RRR, No MRG Respiratory: CTA  B Gastrointestinal: abdomen soft, NT, ND, BS+ Musculoskeletal: no deformities, strength intact in all 4;  Skin: moist, warm, no rashes Neurological: slight tremor with outstretched hands, DTR normal in all 4  ASSESSMENT: 1. MNG - thyroid U/S (01/2013):   Right thyroid lobe: 47 x 14 x 15 mm. Fairly homogeneous background echotexture. Several small nodules, largest lesion 10 x 9 x 9 cm with coarse calcification, mid lobe, corresponding to CT finding. Others are all less than 4 mm.   Left thyroid lobe: 45 x 12 x 13 mm. Fairly homogeneous background echotexture. 7 x 5 x 11 mm hypoechoic solid nodule, inferior pole. Adjacent 4.6 mm hypoechoic/ cystic lesion.   Isthmus : 2.2 mm. No nodules visualized.   Lymphadenopathy: None visualized.  - 03/28/2013: FNA:  Adequacy Reason Satisfactory For Evaluation. Diagnosis THYROID, FINE NEEDLE ASPIRATION, RIGHT (SPECIMEN 1 OF 1, COLLECTED ON 03/27/13): BENIGN (BETHESDA CLASS II). FINDINGS CONSISTENT WITH BENIGN FOLLICULAR NODULE.  PLAN: 1. MNG  - we reviewed the thyroid test results and her previous U/S and Bx results with pt and her husband - pt with a h/o goiter, with 2x 1 cm dominant nodules, one in R and one in L lobe, none large or worrisome appearing. The R nodule was Bx'ed 1 year ago >> benign. Pt does not have a thyroid cancer family history or a personal history of RxTx to head/neck. She does not have neck compression sxs.  - we discussed to repeat her U/S in 1 year. If nodules grow >> may need Bx especially for the L nodule - OTW, we can continue to follow her on a yearly basis, and check another ultrasound in another year or 2. - her TSH was normal at last check 3 mo ago - I'll see her back in a year, but she will call me before to order the U/S so we can review it together at the time of the visit

## 2014-03-24 ENCOUNTER — Ambulatory Visit (INDEPENDENT_AMBULATORY_CARE_PROVIDER_SITE_OTHER): Payer: 59 | Admitting: Gastroenterology

## 2014-03-24 ENCOUNTER — Other Ambulatory Visit (INDEPENDENT_AMBULATORY_CARE_PROVIDER_SITE_OTHER): Payer: 59

## 2014-03-24 ENCOUNTER — Encounter: Payer: Self-pay | Admitting: Gastroenterology

## 2014-03-24 VITALS — BP 110/74 | HR 96 | Ht 67.0 in | Wt 163.4 lb

## 2014-03-24 DIAGNOSIS — R7989 Other specified abnormal findings of blood chemistry: Secondary | ICD-10-CM | POA: Diagnosis not present

## 2014-03-24 DIAGNOSIS — K760 Fatty (change of) liver, not elsewhere classified: Secondary | ICD-10-CM

## 2014-03-24 DIAGNOSIS — R945 Abnormal results of liver function studies: Principal | ICD-10-CM

## 2014-03-24 LAB — PROTIME-INR
INR: 1 ratio (ref 0.8–1.0)
Prothrombin Time: 11.5 s (ref 9.6–13.1)

## 2014-03-24 LAB — FERRITIN: FERRITIN: 52.2 ng/mL (ref 10.0–291.0)

## 2014-03-24 LAB — IBC PANEL
Iron: 67 ug/dL (ref 42–145)
Saturation Ratios: 15.1 % — ABNORMAL LOW (ref 20.0–50.0)
TRANSFERRIN: 316 mg/dL (ref 212.0–360.0)

## 2014-03-24 LAB — IGA: IgA: 154 mg/dL (ref 68–378)

## 2014-03-24 NOTE — Patient Instructions (Addendum)
Your physician has requested that you go to the basement for lab work before leaving today.  You have been given a Low fat diet. A normal BMI is 18.5-24.9.   Thank you for choosing me and [facility].  Pricilla Riffle. Dagoberto Ligas., MD., Marval Regal  cc: Karma Greaser, MD

## 2014-03-24 NOTE — Progress Notes (Signed)
History of Present Illness: This is a  49 year old female previously followed by Dr. Sharlett Iles with IBS. She is referred to me by Dr. Coralyn Mark for evaluation of abnormal LFTs and fatty liver on ultrasound as below.  She has no prior history of liver disease or abnormal LFTs. Recent ALT will levels were 70 and 61. Other LFTs were normal. An acute hepatitis panel was negative. ANA was negative. There is no family history of liver disease or cirrhosis except for a brother who had hepatitis C. She's been on weight loss program and has lost about 16 pounds over the past few months. Recent cholesterol of 249 with LDL of 165. Denies weight loss, abdominal pain, constipation, diarrhea, change in stool caliber, melena, hematochezia, nausea, vomiting, dysphagia, reflux symptoms, chest pain.   IMPRESSION: 1. Diffuse fatty infiltration of the liver but no focal hepatic lesion or intrahepatic biliary dilatation. 2. Normal gallbladder and normal caliber common bile duct. 3. Normal pancreas, spleen and kidneys.   No Known Allergies Outpatient Prescriptions Prior to Visit  Medication Sig Dispense Refill  . Clindamycin-Benzoyl Per, Refr, gel   0  . Cranberry 1000 MG CAPS Take 1 capsule by mouth daily.    . fexofenadine-pseudoephedrine (ALLEGRA-D) 60-120 MG per tablet Take 1 tablet by mouth daily.     . mometasone (NASONEX) 50 MCG/ACT nasal spray Place 2 sprays into the nose daily.    Marland Kitchen OVER THE COUNTER MEDICATION 1 tablet daily. Yeast guard    . Probiotic Product (PROBIOTIC PO) Take by mouth. "culterelle"    . Vitamin D, Cholecalciferol, 1000 UNITS CAPS Take 2 capsules by mouth daily.    . Vitamin D, Ergocalciferol, (DRISDOL) 50000 UNITS CAPS capsule Take once a week for 12 weeks 12 capsule 0   No facility-administered medications prior to visit.   Past Medical History  Diagnosis Date  . IBS (irritable bowel syndrome)     LACTOSE INTOLERANT  . Lactose intolerance   . Hyperlipemia   . Anxiety   .  Thyroid nodule   . Allergy   . 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 Fatty infiltration of liver    Past Surgical History  Procedure Laterality Date  . Breast fibroadenoma surgery  05/2009    FIBROADENOMA RIGHT BREAST  . Breast biopsy Right     x 2   History   Social History  . Marital Status: Married    Spouse Name: N/A  . Number of Children: 3  . 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 Smoking status: Former Smoker    Quit date: 10/23/2003  . Smokeless tobacco: Never Used  . Alcohol Use: No  . Drug Use: No  . Sexual Activity:    Partners: Male    Birth Control/ Protection: Other-see comments, Surgical     Comment: vasectomy-husband   Other Topics Concern  . None   Social History Narrative   Family History  Problem Relation Age of Onset  . Breast cancer Maternal Grandmother     early fifties  . COPD Father   . Heart disease Father   . Colon cancer Neg Hx   . Colon polyps Neg Hx   . Pancreatic cancer Neg Hx   . Prostate cancer Neg Hx   . Kidney disease Neg Hx   . Liver disease Brother     contracted Hepatitis as Forensic scientist    Physical Exam:  General: Well developed , well nourished, no acute distress Head: Normocephalic and atraumatic Eyes:  sclerae anicteric, EOMI Ears: Normal auditory acuity Mouth: No deformity or lesions Lungs: Clear throughout to auscultation Heart: Regular rate and rhythm; no murmurs, rubs or bruits Abdomen: Soft, non tender and non distended. No masses, hepatosplenomegaly or hernias noted. Normal Bowel sounds Musculoskeletal: Symmetrical with no gross deformities  Pulses:  Normal pulses noted Extremities: No clubbing, cyanosis, edema or deformities noted Neurological: Alert oriented x 4, grossly nonfocal Psychological:  Alert and cooperative. Normal mood and affect  Assessment and Recommendations:  1. Elevated ALT, fatty liver. Possibly related to  elevated cholesterol and/or body weight. Encouraged to continue weight loss program, low fat diet to obtain and maintain a normal BMI and target weight. Recommend optimal control of her cholesterol by her PCP. If her serologic evaluation does not reveal any other significant liver disease there would be no contraindication to using a statin drug if necessary to optimally manage her elevated cholesterol. Standard hepatic serologies to evaluate for other causes of liver disease.  2. IBS. Currently asymptomatic.

## 2014-03-25 ENCOUNTER — Other Ambulatory Visit: Payer: Self-pay

## 2014-03-25 LAB — ANA: Anti Nuclear Antibody(ANA): NEGATIVE

## 2014-03-25 LAB — ANTI-SMOOTH MUSCLE ANTIBODY, IGG: SMOOTH MUSCLE AB: 6 U (ref <20)

## 2014-03-25 LAB — HEPATITIS B SURFACE ANTIBODY,QUALITATIVE: Hep B S Ab: NEGATIVE

## 2014-03-25 LAB — TISSUE TRANSGLUTAMINASE, IGA: Tissue Transglutaminase Ab, IgA: 1 U/mL (ref <4)

## 2014-03-25 LAB — HEPATITIS B SURFACE ANTIGEN: Hepatitis B Surface Ag: NEGATIVE

## 2014-03-25 MED ORDER — IRON 325 (65 FE) MG PO TABS: 325.0000 mg | ORAL_TABLET | Freq: Two times a day (BID) | ORAL | Status: DC

## 2014-03-26 LAB — MITOCHONDRIAL ANTIBODIES: Mitochondrial M2 Ab, IgG: 0.03 (ref <0.91)

## 2014-03-26 LAB — CERULOPLASMIN: Ceruloplasmin: 28 mg/dL (ref 18–53)

## 2014-03-26 LAB — ALPHA-1-ANTITRYPSIN: A-1 Antitrypsin, Ser: 129 mg/dL (ref 83–199)

## 2014-04-21 ENCOUNTER — Other Ambulatory Visit: Payer: Self-pay | Admitting: *Deleted

## 2014-04-21 DIAGNOSIS — D509 Iron deficiency anemia, unspecified: Secondary | ICD-10-CM

## 2014-04-21 DIAGNOSIS — E785 Hyperlipidemia, unspecified: Secondary | ICD-10-CM

## 2014-04-29 LAB — IRON AND TIBC
%SAT: 28 % (ref 20–55)
Iron: 102 ug/dL (ref 42–145)
TIBC: 362 ug/dL (ref 250–470)
UIBC: 260 ug/dL (ref 125–400)

## 2014-04-29 LAB — COMPLETE METABOLIC PANEL WITH GFR
ALBUMIN: 3.8 g/dL (ref 3.5–5.2)
ALK PHOS: 38 U/L — AB (ref 39–117)
ALT: 22 U/L (ref 0–35)
AST: 13 U/L (ref 0–37)
BILIRUBIN TOTAL: 0.5 mg/dL (ref 0.2–1.2)
BUN: 7 mg/dL (ref 6–23)
CALCIUM: 9.5 mg/dL (ref 8.4–10.5)
CO2: 23 mEq/L (ref 19–32)
CREATININE: 0.71 mg/dL (ref 0.50–1.10)
Chloride: 104 mEq/L (ref 96–112)
GFR, Est Non African American: >89 mL/min
Glucose, Bld: 86 mg/dL (ref 70–99)
POTASSIUM: 4.3 meq/L (ref 3.5–5.3)
Sodium: 137 mEq/L (ref 135–145)
TOTAL PROTEIN: 6.2 g/dL (ref 6.0–8.3)

## 2014-04-29 LAB — LIPID PANEL
Cholesterol: 163 mg/dL (ref 0–200)
HDL: 42 mg/dL — AB (ref >=46)
LDL CALC: 105 mg/dL — AB (ref 0–99)
Total CHOL/HDL Ratio: 3.9 Ratio
Triglycerides: 80 mg/dL (ref <150)
VLDL: 16 mg/dL (ref 0–40)

## 2014-05-01 ENCOUNTER — Encounter: Payer: Self-pay | Admitting: Internal Medicine

## 2014-05-01 ENCOUNTER — Ambulatory Visit (INDEPENDENT_AMBULATORY_CARE_PROVIDER_SITE_OTHER): Payer: 59 | Admitting: Internal Medicine

## 2014-05-01 VITALS — BP 98/61 | HR 105 | Resp 16 | Ht 67.0 in | Wt 154.0 lb

## 2014-05-01 DIAGNOSIS — E785 Hyperlipidemia, unspecified: Secondary | ICD-10-CM

## 2014-05-01 DIAGNOSIS — K760 Fatty (change of) liver, not elsewhere classified: Secondary | ICD-10-CM | POA: Diagnosis not present

## 2014-05-01 DIAGNOSIS — R7989 Other specified abnormal findings of blood chemistry: Secondary | ICD-10-CM | POA: Diagnosis not present

## 2014-05-01 DIAGNOSIS — R945 Abnormal results of liver function studies: Secondary | ICD-10-CM

## 2014-05-01 NOTE — Progress Notes (Signed)
Subjective:    Patient ID: Michele Manning, female    DOB: 08-04-1965, 49 y.o.   MRN: [id number]  HPI 03/21/20216 GI note  Assessment and Recommendations:  1. Elevated ALT, fatty liver. Possibly related to elevated cholesterol and/or body weight. Encouraged to continue weight loss program, low fat diet to obtain and maintain a normal BMI and target weight. Recommend optimal control of her cholesterol by her PCP. If her serologic evaluation does not reveal any other significant liver disease there would be no contraindication to using a statin drug if necessary to optimally manage her elevated cholesterol. Standard hepatic serologies to evaluate for other causes of liver disease.  2. IBS. Currently asymptomatic.  TODAY  Michele Manning is here for follow up  She is happy as she has lost 22 lbs.    See labs her cholesterol profile is great and elevated  ALT now normalized    She has been on FE bid per GI and would like to try once a day   No Known Allergies Past Medical History  Diagnosis Date  . IBS (irritable bowel syndrome)     LACTOSE INTOLERANT  . Lactose intolerance   . Hyperlipemia   . Anxiety   . Thyroid nodule   . Allergy   . 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 Fatty infiltration of liver    Past Surgical History  Procedure Laterality Date  . Breast fibroadenoma surgery  05/2009    FIBROADENOMA RIGHT BREAST  . Breast biopsy Right     x 2   History   Social History  . Marital Status: Married    Spouse Name: N/A  . Number of Children: 3  . 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 Smoking status: Former Smoker    Quit date: 10/23/2003  . Smokeless tobacco: Never Used  . Alcohol Use: No  . Drug Use: No  . Sexual Activity:    Partners: Male    Birth Control/ Protection: Other-see comments, Surgical     Comment: vasectomy-husband   Other Topics Concern  . Not on file   Social History Narrative    Family History  Problem Relation Age of Onset  . Breast cancer Maternal Grandmother     early fifties  . COPD Father   . Heart disease Father   . Colon cancer Neg Hx   . Colon polyps Neg Hx   . Pancreatic cancer Neg Hx   . Prostate cancer Neg Hx   . Kidney disease Neg Hx   . Liver disease Brother     contracted Hepatitis as Forensic scientist   Patient Active Problem List   Diagnosis Date Noted  . Hepatic steatosis  See U/S 01/2014 02/02/2014  . Fibroadenoma of right breast 01/23/2014  . Elevated ALT measurement 01/23/2014  . Vitamin D deficiency 12/16/2013  . Multinodular goiter 03/28/2013  . Insomnia 10/22/2012  . Hyperlipidemia 10/22/2012  . Stress incontinence 10/22/2012  . Tonsillar cyst 10/22/2012  . Abnormal thyroid scan 10/22/2012  . Allergic rhinitis 10/22/2012  . Fibroadenoma of breast   . IBS (irritable bowel syndrome)    Current Outpatient Prescriptions on File Prior to Visit  Medication Sig Dispense Refill  . Clindamycin-Benzoyl Per, Refr, gel   0  . Cranberry 1000 MG CAPS Take 1 capsule by mouth daily.    . Ferrous Sulfate (IRON) 325 (65 FE) MG TABS Take 325 mg  by mouth 2 (two) times daily with a meal. 30 each 0  . fexofenadine-pseudoephedrine (ALLEGRA-D) 60-120 MG per tablet Take 1 tablet by mouth daily.     . mometasone (NASONEX) 50 MCG/ACT nasal spray Place 2 sprays into the nose daily.    Marland Kitchen OVER THE COUNTER MEDICATION 1 tablet daily. Yeast guard    . Probiotic Product (PROBIOTIC PO) Take by mouth. "culterelle"    . Vitamin D, Cholecalciferol, 1000 UNITS CAPS Take 2 capsules by mouth daily.     No current facility-administered medications on file prior to visit.       Review of Systems See HPI    Objective:   Physical Exam Physical Exam  Nursing note and vitals reviewed.  Constitutional: She is oriented to person, place, and time. She appears well-developed and well-nourished.  HENT:  Head: Normocephalic and atraumatic.  Cardiovascular:  Normal rate and regular rhythm. Exam reveals no gallop and no friction rub.  No murmur heard.  Pulmonary/Chest: Breath sounds normal. She has no wheezes. She has no rales.  Neurological: She is alert and oriented to person, place, and time.  Skin: Skin is warm and dry.  Psychiatric: She has a normal mood and affect. Her behavior is normal.              Assessment & Plan:  Hyperlipidemia now normal  With wt loss  Elevated alt now normal   Ok to take FE once a day   Pt has received a letter of my departure and advised to make a new pt appt with provider of choice

## 2014-08-12 ENCOUNTER — Encounter: Payer: Self-pay | Admitting: Gynecology

## 2014-08-12 ENCOUNTER — Ambulatory Visit (INDEPENDENT_AMBULATORY_CARE_PROVIDER_SITE_OTHER): Payer: 59 | Admitting: Gynecology

## 2014-08-12 VITALS — BP 116/70

## 2014-08-12 DIAGNOSIS — N923 Ovulation bleeding: Secondary | ICD-10-CM

## 2014-08-12 DIAGNOSIS — B3731 Acute candidiasis of vulva and vagina: Secondary | ICD-10-CM

## 2014-08-12 DIAGNOSIS — B373 Candidiasis of vulva and vagina: Secondary | ICD-10-CM | POA: Diagnosis not present

## 2014-08-12 DIAGNOSIS — N939 Abnormal uterine and vaginal bleeding, unspecified: Secondary | ICD-10-CM

## 2014-08-12 DIAGNOSIS — N930 Postcoital and contact bleeding: Secondary | ICD-10-CM | POA: Diagnosis not present

## 2014-08-12 LAB — WET PREP FOR TRICH, YEAST, CLUE
Clue Cells Wet Prep HPF POC: NONE SEEN
TRICH WET PREP: NONE SEEN

## 2014-08-12 MED ORDER — FLUCONAZOLE 150 MG PO TABS: 150.0000 mg | ORAL_TABLET | Freq: Once | ORAL | Status: DC

## 2014-08-12 NOTE — Addendum Note (Signed)
Addended by: Nelva Nay on: 08/12/2014 12:49 PM   Modules accepted: Orders

## 2014-08-12 NOTE — Patient Instructions (Signed)
Take the Diflucan pill daily for 3 days. Follow up if the bleeding continues.

## 2014-08-12 NOTE — Progress Notes (Signed)
Michele Manning Jul 13, 1965 [id number]        49 y.o.  G3P3 Presents complaining of several episodes of postcoital bleeding. Notes light staining the morning following intercourse. Also notes this last cycle started and ended on time and then one week later she had some breakthrough bleeding for several days which she has not done previously. Using vasectomy birth control. No vaginal discharge or odor. No frequency dysuria urgency.  Past medical history,surgical history, problem list, medications, allergies, family history and social history were all reviewed and documented in the EPIC chart.  Directed ROS with pertinent positives and negatives documented in the history of present illness/assessment and plan.  Exam: Kim assistant Filed Vitals:   08/12/14 0903  BP: 116/70   General appearance:  Normal Abdomen soft nontender without masses guarding rebound Pelvic external BUS vagina with white discharge. Cervix grossly normal without bleeding. Uterus anteverted normal size midline mobile nontender. Adnexa without masses or tenderness.  Assessment/Plan:  49 y.o. G3P3 with:  1. Several episodes of postcoital bleeding. Exam shows a white discharge with KOH wet prep confirming yeast. I suspect that she is having some yeast cervicitis which is leading to the bleeding. We'll treat with Diflucan 150 mg 3 doses. Follow up if symptoms persist or recur. 2. Single episode of breakthrough bleeding. Discussed differential to include hormonal dysfunction versus structural such as polyps. As this is only occurred once she'll keep a menstrual calendar and if recurs she knows to call me and we'll start with sonohysterogram. If remains without breakthrough bleeding them will follow expectantly and she'll represent when she is due for her annual exam this coming fall.    Anastasio Auerbach MD, 9:33 AM 08/12/2014

## 2014-10-31 ENCOUNTER — Encounter: Payer: 59 | Admitting: Gynecology

## 2014-11-03 ENCOUNTER — Encounter: Payer: Self-pay | Admitting: Gynecology

## 2014-11-03 ENCOUNTER — Ambulatory Visit (INDEPENDENT_AMBULATORY_CARE_PROVIDER_SITE_OTHER): Payer: 59 | Admitting: Gynecology

## 2014-11-03 VITALS — BP 118/76 | Ht 67.0 in | Wt 156.0 lb

## 2014-11-03 DIAGNOSIS — Z01419 Encounter for gynecological examination (general) (routine) without abnormal findings: Secondary | ICD-10-CM | POA: Diagnosis not present

## 2014-11-03 NOTE — Patient Instructions (Signed)

## 2014-11-03 NOTE — Progress Notes (Signed)
Michele Manning 1965/03/13 [id number]        49 y.o.  G3P3  Patient's last menstrual period was 10/14/2014. for annual exam.  Doing well without complaints.  Past medical history,surgical history, problem list, medications, allergies, family history and social history were all reviewed and documented as reviewed in the EPIC chart.  ROS:  Performed with pertinent positives and negatives included in the history, assessment and plan.   Additional significant findings :  none   Exam: Kim Counsellor Vitals:   11/03/14 1534  BP: 118/76  Height: 5\' 7"  (1.702 m)  Weight: 156 lb (70.761 kg)   General appearance:  Normal affect, orientation and appearance. Skin: Grossly normal HEENT: Without gross lesions.  No cervical or supraclavicular adenopathy. Thyroid normal.  Lungs:  Clear without wheezing, rales or rhonchi Cardiac: RR, without RMG Abdominal:  Soft, nontender, without masses, guarding, rebound, organomegaly or hernia Breasts:  Examined lying and sitting without masses, retractions, discharge or axillary adenopathy. Pelvic:  Ext/BUS/vagina normal  Cervix normal  Uterus anteverted, normal size, shape and contour, midline and mobile nontender   Adnexa  Without masses or tenderness    Anus and perineum  Normal   Rectovaginal  Normal sphincter tone without palpated masses or tenderness.    Assessment/Plan:  49 y.o. G3P3 female for annual exam with regular menses, vasectomy birth control.   1. Mammography coming due this December and she will schedule when due. SBE monthly reviewed. 2. Pap smear/HPV 2015 negative.  No Pap smear done today.  No history of abnormal Pap smears previously. 3. Colonoscopy 2012. Repeat at their recommended interval. 4. Health maintenance. No routine blood work done as patient reports is done at her primary physician's office. Follow up 1 year, sooner as needed.   Anastasio Auerbach MD, 3:54 PM 11/03/2014

## 2014-11-04 LAB — URINALYSIS W MICROSCOPIC + REFLEX CULTURE
Bacteria, UA: NONE SEEN [HPF]
Bilirubin Urine: NEGATIVE
CASTS: NONE SEEN [LPF]
Crystals: NONE SEEN [HPF]
Glucose, UA: NEGATIVE
Hgb urine dipstick: NEGATIVE
Ketones, ur: NEGATIVE
Leukocytes, UA: NEGATIVE
NITRITE: NEGATIVE
Protein, ur: NEGATIVE
RBC / HPF: NONE SEEN RBC/HPF (ref <=2)
SPECIFIC GRAVITY, URINE: 1.006 (ref 1.001–1.035)
Squamous Epithelial / LPF: NONE SEEN [HPF] (ref <=5)
WBC, UA: NONE SEEN WBC/HPF (ref <=5)
Yeast: NONE SEEN [HPF]
pH: 7.5 (ref 5.0–8.0)

## 2015-01-13 ENCOUNTER — Encounter: Payer: Self-pay | Admitting: Behavioral Health

## 2015-01-13 ENCOUNTER — Telehealth: Payer: Self-pay | Admitting: Behavioral Health

## 2015-01-13 NOTE — Telephone Encounter (Signed)
Pre-Visit Call completed with patient and chart updated.   Pre-Visit Info documented in Specialty Comments under [employer].    

## 2015-01-13 NOTE — Telephone Encounter (Signed)
Patient voiced that she will return the call later; currently she's at the veterinarian office with her cats.

## 2015-01-13 NOTE — Addendum Note (Signed)
Addended by: Eduard Roux E on: 01/13/2015 01:48 PM   Modules accepted: Orders, Medications

## 2015-01-14 ENCOUNTER — Encounter: Payer: Self-pay | Admitting: Family

## 2015-01-14 ENCOUNTER — Ambulatory Visit (INDEPENDENT_AMBULATORY_CARE_PROVIDER_SITE_OTHER): Payer: 59 | Admitting: Family

## 2015-01-14 VITALS — BP 112/62 | HR 93 | Temp 98.0°F | Resp 16 | Ht 66.25 in | Wt 159.4 lb

## 2015-01-14 DIAGNOSIS — J309 Allergic rhinitis, unspecified: Secondary | ICD-10-CM

## 2015-01-14 DIAGNOSIS — Z0000 Encounter for general adult medical examination without abnormal findings: Secondary | ICD-10-CM

## 2015-01-14 DIAGNOSIS — E559 Vitamin D deficiency, unspecified: Secondary | ICD-10-CM

## 2015-01-14 DIAGNOSIS — E042 Nontoxic multinodular goiter: Secondary | ICD-10-CM

## 2015-01-14 DIAGNOSIS — E785 Hyperlipidemia, unspecified: Secondary | ICD-10-CM

## 2015-01-14 NOTE — Progress Notes (Signed)
Subjective:    Patient ID: Michele Manning, female    DOB: [birth year], 50 y.o.   MRN: [id number]  HPI   Michele Manning is a 50 yr old female who presents today to establish care. She was previously followed by Dr. Coralyn Manning.  Lactose  Intolerant- uses lactaid which helps.  Allergic rhinitis-  Uses allegra D year round.  Also uses nasonex. Reports that she has had allergy shots in the past and that this really did not help her.   Vit D deficiency- uses otc supplement  Hyperlipidemia- not on meds.  Also has hx of fatty liver.    Hx Anemia-  Reports that she has been treated with iron in the past.   Lab Results  Component Value Date   WBC 6.9 12/05/2013   HGB 14.2 12/05/2013   HCT 41.5 12/05/2013   MCV 84.2 12/05/2013   PLT 315 12/05/2013   Thyroid nodule- follows with Michele Manning. Reports negative thyroid biopsy in the past. Has upcoming apt with Michele Manning.    Review of Systems  Constitutional:       6 pounds of weight gain in last 6 months over the holidays  HENT: Positive for rhinorrhea.   Respiratory: Negative for cough and shortness of breath.   Cardiovascular: Negative for chest pain.  Gastrointestinal: Negative for diarrhea and constipation.  Genitourinary: Negative for dysuria, frequency and menstrual problem.  Musculoskeletal: Negative for myalgias and arthralgias.  Skin: Negative for rash.  Neurological: Negative for headaches.  Hematological: Negative for adenopathy.  Psychiatric/Behavioral:       Denies depression/anxiety       Past Medical History  Diagnosis Date  . IBS (irritable bowel syndrome)     LACTOSE INTOLERANT  . Lactose intolerance   . Hyperlipemia   . Anxiety   . Thyroid nodule   . Allergy   . 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 Fatty infiltration of liver   . Anemia     Social History   Social History  . Marital Status: Married    Spouse Name: N/A  . Number of Children: 3  . Years of Education: N/A   Occupational History  .  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 Smoking status: Former Smoker    Quit date: 10/23/2003  . Smokeless tobacco: Never Used  . Alcohol Use: 0.6 oz/week    1 Standard drinks or equivalent per week  . Drug Use: No  . Sexual Activity:    Partners: Male    Birth Control/ Protection: Other-see comments, Surgical     Comment: vasectomy-husband-1st intercourse 21 yo-Fewer than 5 partners   Other Topics Concern  . Not on file   Social History Narrative   Married   3 children   1997-daughter   2001-son   2002- daughter   Teaches pre-k, Brewing technologist   Enjoys geneology       Past Surgical History  Procedure Laterality Date  . Breast fibroadenoma surgery  05/2009    FIBROADENOMA RIGHT BREAST  . Breast biopsy Right     x 2  . Biopsy thyroid  2015    Family History  Problem Relation Age of Onset  . Breast cancer Maternal Grandmother     early fifties  . COPD Father   . Heart disease Father   . Colon cancer Neg Hx   . Colon polyps Neg Hx   . Pancreatic cancer Neg Hx   . Prostate  cancer Neg Hx   . Kidney disease Neg Hx   . Liver disease Brother     contracted Hepatitis as Forensic scientist    No Known Allergies  Current Outpatient Prescriptions on File Prior to Visit  Medication Sig Dispense Refill  . Calcium Citrate-Vitamin D (CALCIUM + D PO) Take by mouth.    . Cranberry 1000 MG CAPS Take 1 capsule by mouth daily.    . fexofenadine-pseudoephedrine (ALLEGRA-D) 60-120 MG per tablet Take 1 tablet by mouth daily.     Marland Kitchen OVER THE COUNTER MEDICATION 1 tablet daily. Yeast guard    . Probiotic Product (PROBIOTIC PO) Take by mouth. "culterelle"    . Vitamin D, Cholecalciferol, 1000 UNITS CAPS Take 2 capsules by mouth daily.     No current facility-administered medications on file prior to visit.    BP 112/62 mmHg  Pulse 93  Temp(Src) 98 F (36.7 C) (Oral)  Resp 16  Ht 5' 6.25" (1.683 m)  Wt 159 lb 6.4 oz (72.303 kg)  BMI 25.53 kg/m2   SpO2 100%  LMP 12/31/2014    Objective:   Physical Exam  Constitutional: She is oriented to person, place, and time. She appears well-developed and well-nourished.  HENT:  Head: Normocephalic and atraumatic.  Eyes: Pupils are equal, round, and reactive to light.  Neck: No thyromegaly present.  Cardiovascular: Normal rate, regular rhythm and normal heart sounds.   No murmur heard. Pulmonary/Chest: Effort normal and breath sounds normal. No respiratory distress. She has no wheezes.  Musculoskeletal: She exhibits no edema.  Neurological: She is alert and oriented to person, place, and time.  Skin: Skin is warm and dry.  Psychiatric: She has a normal mood and affect. Her behavior is normal. Judgment and thought content normal.          Assessment & Plan:

## 2015-01-14 NOTE — Assessment & Plan Note (Signed)
Management per endocrinology.

## 2015-01-14 NOTE — Assessment & Plan Note (Signed)
Stable on allegra D and nasonex.

## 2015-01-14 NOTE — Assessment & Plan Note (Signed)
Discussed low cholesterol diet, exercise, weight loss.

## 2015-01-14 NOTE — Progress Notes (Signed)
Pre visit review using our clinic review tool, if applicable. No additional management support is needed unless otherwise documented below in the visit note. 

## 2015-01-14 NOTE — Patient Instructions (Signed)
Please schedule a complete physical at the front desk. Welcome to [facility]! 

## 2015-01-14 NOTE — Assessment & Plan Note (Signed)
Continue otc supplement. Plan to check vit D level at her upcoming cpx.

## 2015-02-02 ENCOUNTER — Telehealth: Payer: Self-pay | Admitting: Family

## 2015-02-02 NOTE — Telephone Encounter (Signed)
-----   Message from Katha Hamming sent at 02/02/2015 12:40 PM EST ----- Regarding: MAMMOGRAM I called Ms. Orloff to schedule her mammogram and she stated she will schedule her own mammogram in [place].  Thanks, Hoyle Sauer

## 2015-02-23 ENCOUNTER — Ambulatory Visit: Payer: 59

## 2015-03-03 ENCOUNTER — Encounter: Payer: 59 | Admitting: Family

## 2015-03-16 ENCOUNTER — Ambulatory Visit
Admission: RE | Admit: 2015-03-16 | Discharge: 2015-03-16 | Disposition: A | Discharge status: Home / Self Care | Payer: 59 | Source: Ambulatory Visit | Attending: Family | Admitting: Family

## 2015-03-16 DIAGNOSIS — Z0000 Encounter for general adult medical examination without abnormal findings: Secondary | ICD-10-CM

## 2015-03-18 ENCOUNTER — Telehealth: Payer: Self-pay | Admitting: Internal Medicine

## 2015-03-18 NOTE — Telephone Encounter (Signed)
Pt called and said she thought she was suppose to have another U/S before her next visit and she wants to confirm whether or not she needs to have this done.

## 2015-03-18 NOTE — Telephone Encounter (Signed)
Please read message below and advise.  

## 2015-03-19 ENCOUNTER — Other Ambulatory Visit: Payer: Self-pay | Admitting: Internal Medicine

## 2015-03-19 DIAGNOSIS — E042 Nontoxic multinodular goiter: Secondary | ICD-10-CM

## 2015-03-19 NOTE — Telephone Encounter (Signed)
Tried calling pt 3 times. Each time there was so much static on my end that I could not hear her. Advised her that Dr Cruzita Lederer has ordered the U/S. If pt returns call.

## 2015-03-19 NOTE — Telephone Encounter (Signed)
Yes, I ordered it. 

## 2015-03-20 ENCOUNTER — Ambulatory Visit: Payer: 59 | Admitting: Internal Medicine

## 2015-04-06 ENCOUNTER — Telehealth: Payer: Self-pay | Admitting: Family

## 2015-04-06 ENCOUNTER — Ambulatory Visit
Admission: RE | Admit: 2015-04-06 | Discharge: 2015-04-06 | Disposition: A | Discharge status: Home / Self Care | Payer: 59 | Source: Ambulatory Visit | Attending: Internal Medicine | Admitting: Internal Medicine

## 2015-04-06 ENCOUNTER — Telehealth: Payer: Self-pay | Admitting: Behavioral Health

## 2015-04-06 NOTE — Telephone Encounter (Signed)
Patient voiced that she will return call later when it's more convenient for her to speak & complete pre-visit info.

## 2015-04-06 NOTE — Telephone Encounter (Signed)
Error

## 2015-04-06 NOTE — Telephone Encounter (Signed)
Pt is returning your call

## 2015-04-07 ENCOUNTER — Ambulatory Visit (INDEPENDENT_AMBULATORY_CARE_PROVIDER_SITE_OTHER): Payer: 59 | Admitting: Family

## 2015-04-07 ENCOUNTER — Encounter: Payer: Self-pay | Admitting: Family

## 2015-04-07 VITALS — BP 124/76 | HR 86 | Temp 98.0°F | Resp 16 | Ht 66.25 in | Wt 160.4 lb

## 2015-04-07 DIAGNOSIS — Z0000 Encounter for general adult medical examination without abnormal findings: Secondary | ICD-10-CM | POA: Diagnosis not present

## 2015-04-07 LAB — BASIC METABOLIC PANEL
BUN: 9 mg/dL (ref 6–23)
CO2: 30 mEq/L (ref 19–32)
Calcium: 9.6 mg/dL (ref 8.4–10.5)
Chloride: 100 mEq/L (ref 96–112)
Creatinine, Ser: 0.67 mg/dL (ref 0.40–1.20)
GFR: 99.75 mL/min (ref >60.00)
GLUCOSE: 102 mg/dL — AB (ref 70–99)
POTASSIUM: 3.7 meq/L (ref 3.5–5.1)
SODIUM: 136 meq/L (ref 135–145)

## 2015-04-07 LAB — CBC WITH DIFFERENTIAL/PLATELET
Basophils Absolute: 0 10*3/uL (ref 0.0–0.1)
Basophils Relative: 0.3 % (ref 0.0–3.0)
EOS PCT: 2.3 % (ref 0.0–5.0)
Eosinophils Absolute: 0.1 10*3/uL (ref 0.0–0.7)
HCT: 40.2 % (ref 36.0–46.0)
HEMOGLOBIN: 13.4 g/dL (ref 12.0–15.0)
Lymphocytes Relative: 31.6 % (ref 12.0–46.0)
Lymphs Abs: 1.5 10*3/uL (ref 0.7–4.0)
MCHC: 33.3 g/dL (ref 30.0–36.0)
MCV: 83.7 fl (ref 78.0–100.0)
MONOS PCT: 6.7 % (ref 3.0–12.0)
Monocytes Absolute: 0.3 10*3/uL (ref 0.1–1.0)
NEUTROS PCT: 59.1 % (ref 43.0–77.0)
Neutro Abs: 2.8 10*3/uL (ref 1.4–7.7)
Platelets: 224 10*3/uL (ref 150.0–400.0)
RBC: 4.8 Mil/uL (ref 3.87–5.11)
RDW: 13.9 % (ref 11.5–15.5)
WBC: 4.7 10*3/uL (ref 4.0–10.5)

## 2015-04-07 LAB — URINALYSIS, ROUTINE W REFLEX MICROSCOPIC
BILIRUBIN URINE: NEGATIVE
HGB URINE DIPSTICK: NEGATIVE
KETONES UR: NEGATIVE
NITRITE: NEGATIVE
PH: 7.5 (ref 5.0–8.0)
RBC / HPF: NONE SEEN (ref 0–?)
Specific Gravity, Urine: <=1.005 — AB (ref 1.000–1.030)
TOTAL PROTEIN, URINE-UPE24: NEGATIVE
URINE GLUCOSE: NEGATIVE
Urobilinogen, UA: 0.2 (ref 0.0–1.0)

## 2015-04-07 LAB — TSH: TSH: 3.08 u[IU]/mL (ref 0.35–4.50)

## 2015-04-07 LAB — HEPATIC FUNCTION PANEL
ALBUMIN: 4.1 g/dL (ref 3.5–5.2)
ALT: 14 U/L (ref 0–35)
AST: 13 U/L (ref 0–37)
Alkaline Phosphatase: 44 U/L (ref 39–117)
BILIRUBIN TOTAL: 0.4 mg/dL (ref 0.2–1.2)
Bilirubin, Direct: 0.1 mg/dL (ref 0.0–0.3)
Total Protein: 6.7 g/dL (ref 6.0–8.3)

## 2015-04-07 LAB — LIPID PANEL
CHOLESTEROL: 175 mg/dL (ref 0–200)
HDL: 49.2 mg/dL (ref >39.00)
LDL CALC: 111 mg/dL — AB (ref 0–99)
NonHDL: 125.3
TRIGLYCERIDES: 73 mg/dL (ref 0.0–149.0)
Total CHOL/HDL Ratio: 4
VLDL: 14.6 mg/dL (ref 0.0–40.0)

## 2015-04-07 NOTE — Patient Instructions (Signed)
Please complete lab work prior to leaving. Keep up the good work with exercise.

## 2015-04-07 NOTE — Telephone Encounter (Signed)
Patient seen in office today. 

## 2015-04-07 NOTE — Progress Notes (Signed)
Subjective:    Patient ID: Michele Manning, female    DOB: Jul 15, 1965, 50 y.o.   MRN: [id number]  HPI  Michele Manning is a 50 yr old female who presents today for cpx.   Immunizations: tetanus up to date.  Diet: reports healthy diet Exercise: joined a gym Pap Smear: 2015 Mammogram: 3/17  Reports that she just finished zpak for URI with cough, cough is improved now has some nasal congestion  Review of Systems  Constitutional: Negative for unexpected weight change.  HENT: Negative for hearing loss.   Eyes:       Last eye exam 1 year ago- wears glasses  Respiratory: Negative for shortness of breath.   Cardiovascular: Negative for chest pain.  Gastrointestinal: Negative for diarrhea, constipation and blood in stool.  Genitourinary: Negative for dysuria and frequency.  Musculoskeletal: Negative for myalgias and arthralgias.  Skin: Negative for rash.  Neurological: Negative for headaches.  Psychiatric/Behavioral:       Denies depression/anxiety   Past Medical History  Diagnosis Date  . IBS (irritable bowel syndrome)     LACTOSE INTOLERANT  . Lactose intolerance   . Hyperlipemia   . Anxiety   . Thyroid nodule   . Allergy   . 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 Fatty infiltration of liver   . Anemia     Social History   Social History  . Marital Status: Married    Spouse Name: N/A  . Number of Children: 3  . 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 Smoking status: Former Smoker    Quit date: 10/23/2003  . Smokeless tobacco: Never Used  . Alcohol Use: 0.6 oz/week    1 Standard drinks or equivalent per week  . Drug Use: No  . Sexual Activity:    Partners: Male    Birth Control/ Protection: Other-see comments, Surgical     Comment: vasectomy-husband-1st intercourse 21 yo-Fewer than 5 partners   Other Topics Concern  . Not on file   Social History Narrative   Married   3 children   1997-daughter   2001-son   2002- daughter   Teaches pre-k, Brewing technologist   Enjoys geneology       Past Surgical History  Procedure Laterality Date  . Breast fibroadenoma surgery  05/2009    FIBROADENOMA RIGHT BREAST  . Breast biopsy Right     x 2  . Biopsy thyroid  2015    Family History  Problem Relation Age of Onset  . Breast cancer Maternal Grandmother     early fifties  . COPD Father   . Heart disease Father   . Colon cancer Neg Hx   . Colon polyps Neg Hx   . Pancreatic cancer Neg Hx   . Prostate cancer Neg Hx   . Kidney disease Neg Hx   . Liver disease Brother     contracted Hepatitis as Forensic scientist    No Known Allergies  Current Outpatient Prescriptions on File Prior to Visit  Medication Sig Dispense Refill  . Calcium Citrate-Vitamin D (CALCIUM + D PO) Take by mouth.    . Cranberry 1000 MG CAPS Take 1 capsule by mouth daily.    . fexofenadine-pseudoephedrine (ALLEGRA-D) 60-120 MG per tablet Take 1 tablet by mouth daily.     Marland Kitchen OVER THE COUNTER MEDICATION 1 tablet daily. [place]. Yeast guard    . Probiotic Product (  PROBIOTIC PO) Take by mouth. "culterelle"    . Vitamin D, Cholecalciferol, 1000 UNITS CAPS Take 2 capsules by mouth daily.     No current facility-administered medications on file prior to visit.    BP 124/76 mmHg  Pulse 86  Temp(Src) 98 F (36.7 C) (Oral)  Resp 16  Ht 5' 6.25" (1.683 m)  Wt 160 lb 6.4 oz (72.757 kg)  BMI 25.69 kg/m2  SpO2 98%  LMP 02/25/2015       Objective:   Physical Exam Physical Exam  Constitutional: She is oriented to person, place, and time. She appears well-developed and well-nourished. No distress.  HENT:  Head: Normocephalic and atraumatic.  Right Ear: Tympanic membrane and ear canal normal.  Left Ear: Tympanic membrane and ear canal normal.  Mouth/Throat: Oropharynx is clear and moist.  Eyes: Pupils are equal, round, and reactive to light. No scleral icterus.  Neck: Normal range of motion. No thyromegaly  present.  Cardiovascular: Normal rate and regular rhythm.   No murmur heard. Pulmonary/Chest: Effort normal and breath sounds normal. No respiratory distress. He has no wheezes. She has no rales. She exhibits no tenderness.  Abdominal: Soft. Bowel sounds are normal. He exhibits no distension and no mass. There is no tenderness. There is no rebound and no guarding.  Musculoskeletal: She exhibits no edema.  Lymphadenopathy:    She has no cervical adenopathy.  Neurological: She is alert and oriented to person, place, and time. She has normal patellar reflexes. She exhibits normal muscle tone. Coordination normal.  Skin: Skin is warm and dry.  Psychiatric: She has a normal mood and affect. Her behavior is normal. Judgment and thought content normal.  Breasts: Examined lying Right: Without masses, retractions, discharge or axillary adenopathy.  Left: Without masses, retractions, discharge or axillary adenopathy.  Pelvic:  Deferred.         Assessment & Plan:          Assessment & Plan:  Preventative health care-  discussed healthy diet, exercise.  Immunizations reviewed and up to date. EKG tracing is personally reviewed.  EKG notes NSR.  No acute changes.

## 2015-04-07 NOTE — Progress Notes (Signed)
Pre visit review using our clinic review tool, if applicable. No additional management support is needed unless otherwise documented below in the visit note. 

## 2015-05-05 IMAGING — CT CT NECK W/ CM
4 of 6 series · 14 of 33 positions shown, 16 images · IV contrast (75CC OMNI 300)
Comparison: None.

CLINICAL DATA: 47-year-old female with tonsillitis [REDACTED] status
post antibiotic therapy. Chronic tonsillitis.

EXAM:
CT NECK WITH CONTRAST
TECHNIQUE: Multidetector CT imaging of the neck was performed using the
standard protocol following the bolus administration of intravenous
contrast.
CONTRAST:  75mL OMNIPAQUE IOHEXOL 300 MG/ML  SOLN

[Series 3: axial neck · axial · 0.37mm/px · z∈[+110,+182]mm · 2 of 88 slices shown]
[im 30/88  bone]
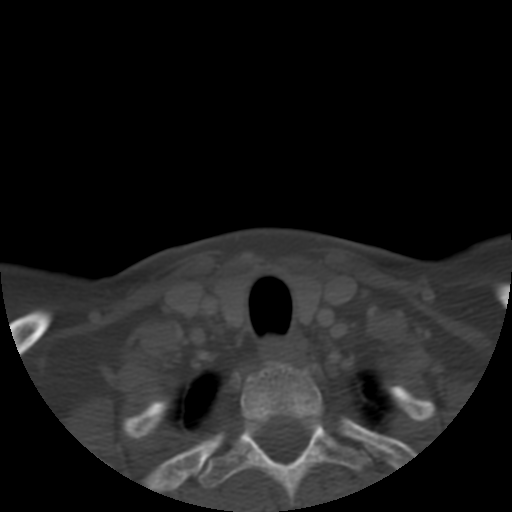
[im 59/88  bone]
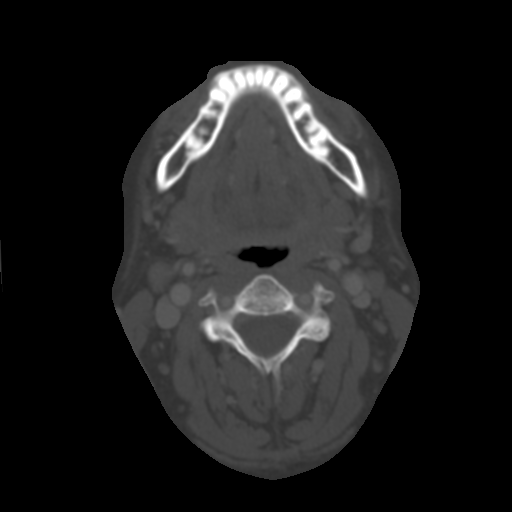

[Series 200: coronal · coronal · 0.44mm/px · 3 of 87 slices shown]
[im 20/87  bone]
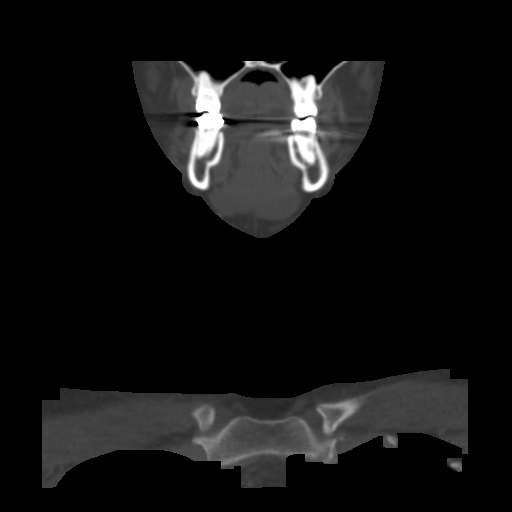
[im 36/87  bone]
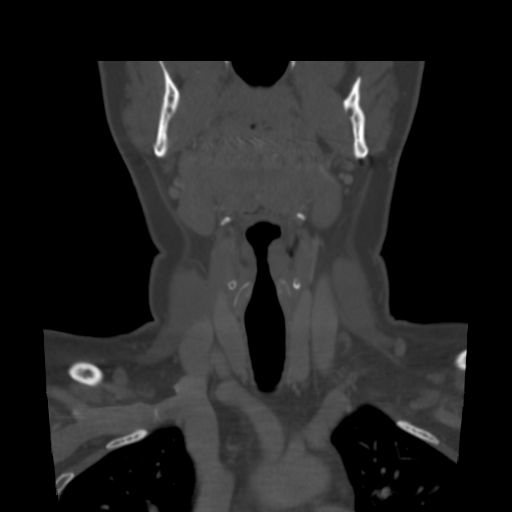
[im 51/87  bone]
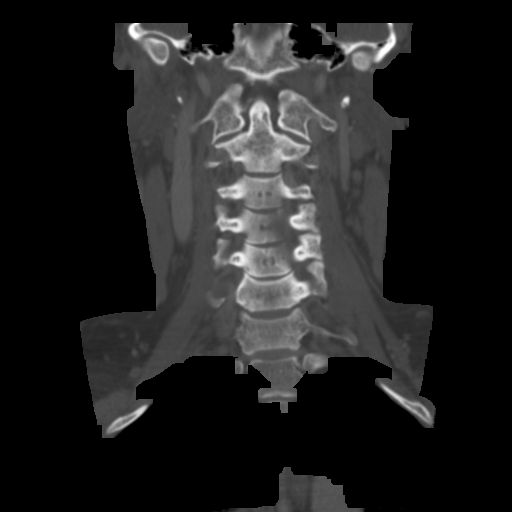

[Series 201: sagittal · sagittal · 0.44mm/px · 5 of 96 slices shown, 6 images]
[im 32/96  bone]
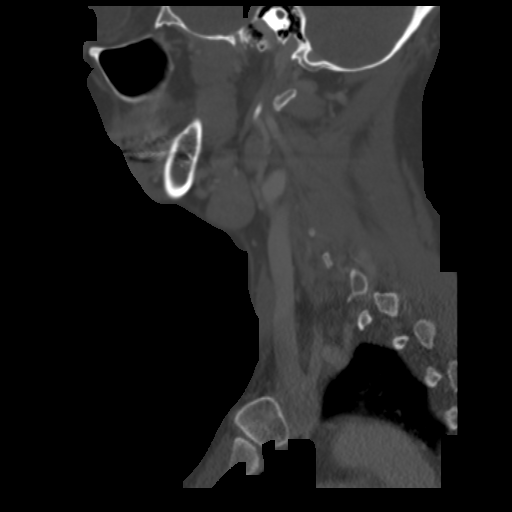
[im 40/96  bone]
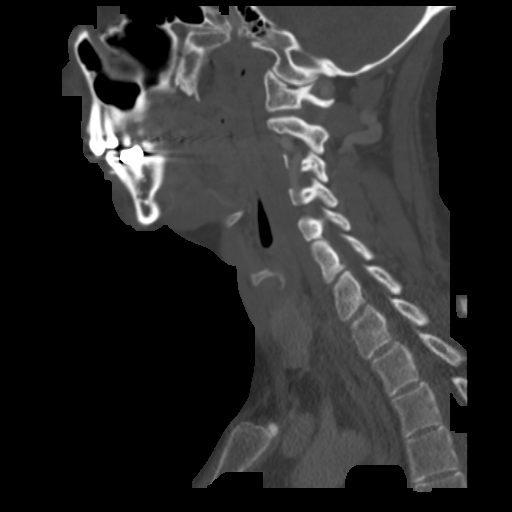
[im 48/96  soft-tissue]
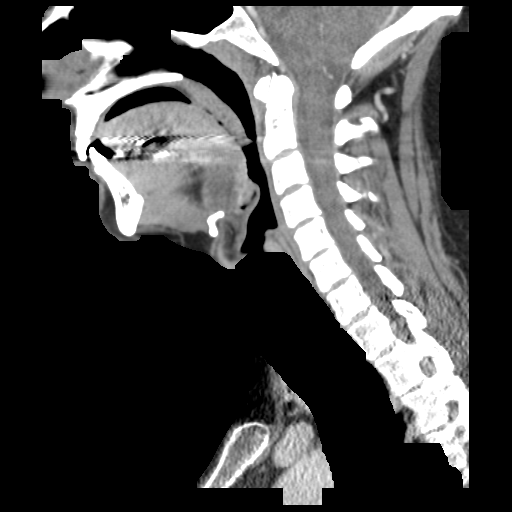
[im 48/96  bone]
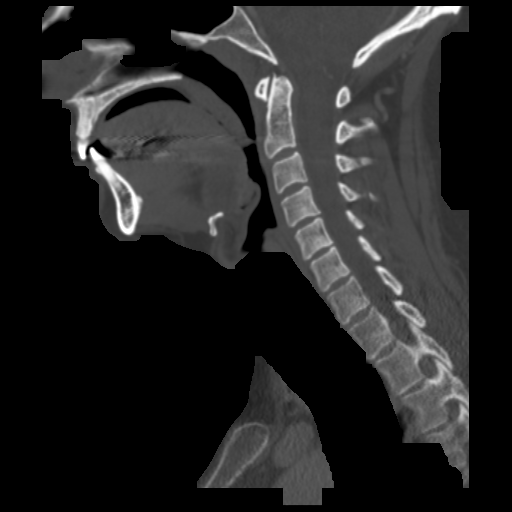
[im 56/96  bone]
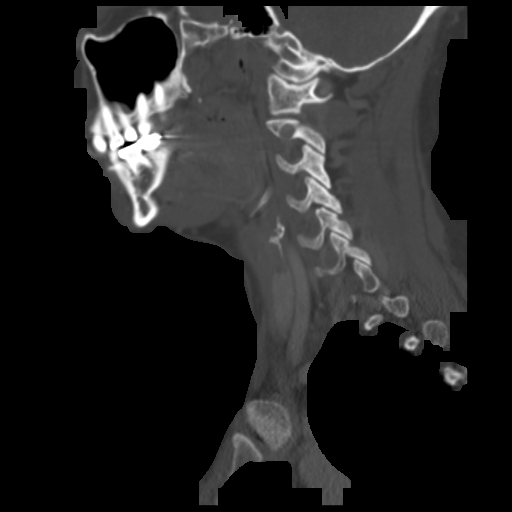
[im 64/96  bone]
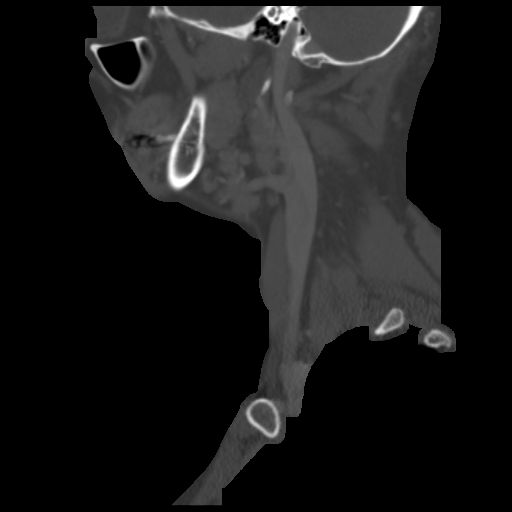

[Series 202: angled to hyoid · axial · 0.44mm/px · z∈[+45,+174]mm · 4 of 115 slices shown, 5 images]
[im 23/115  soft-tissue]
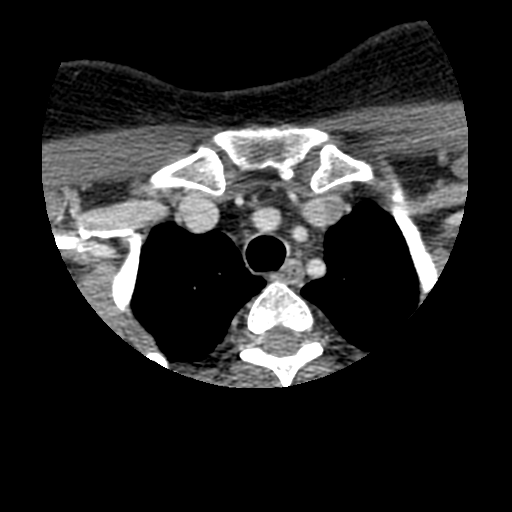
[im 23/115  bone]
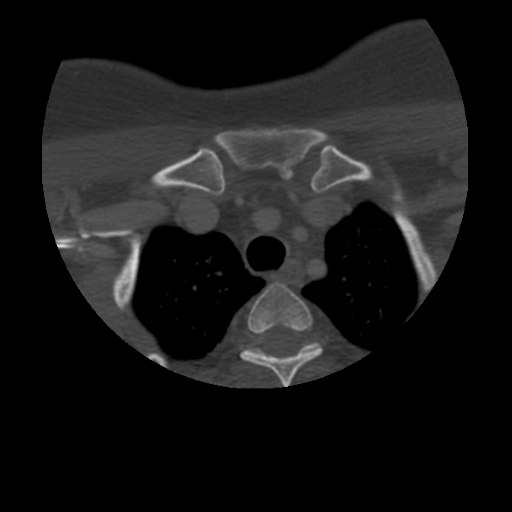
[im 46/115  bone]
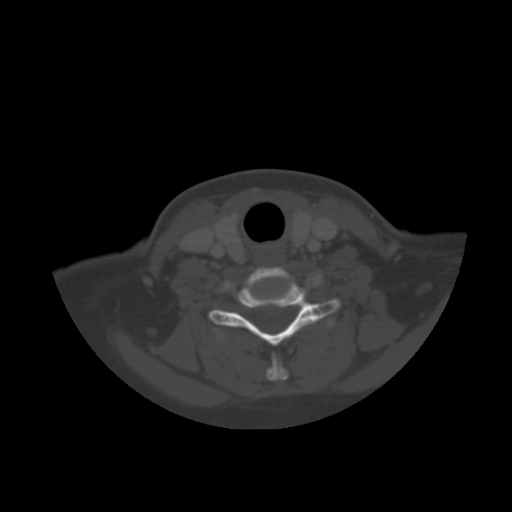
[im 69/115  bone]
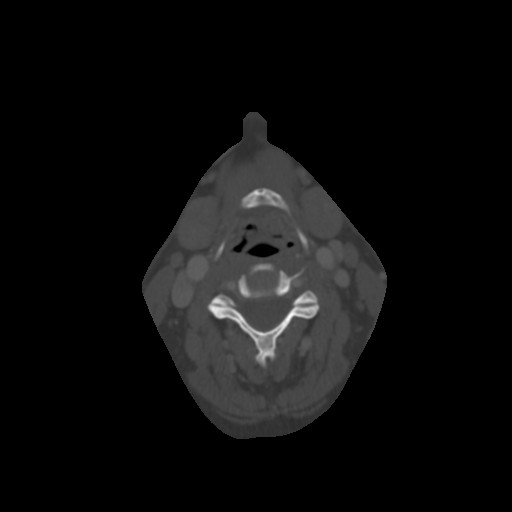
[im 92/115  bone]
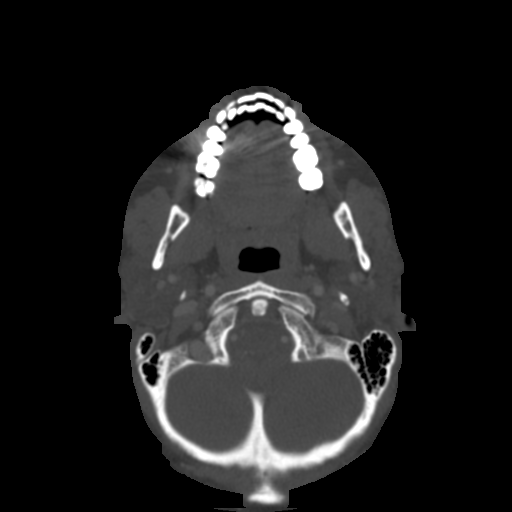

[14 of 33 positions shown; findings below may reference images not displayed]

FINDINGS: Negative lung apices. No superior mediastinal lymphadenopathy. No
osseous abnormality in the visible thorax.

Three Min mm coarse calcification in the right thyroid lobe, the
thyroid otherwise is normal. Larynx and hypopharynx within normal
limits. Tonsillar pillars appear unremarkable. Oral pharynx and
nasopharynx within normal limits. Parapharyngeal spaces,
retropharyngeal space, sublingual space, submandibular glands, and
parotid glands within normal limits. Visualized orbit soft tissues
are within normal limits. Negative visualized brain parenchyma.

Major vascular structures in the neck and at the skullbase are
patent. No atherosclerosis.

Maximal right level 2 lymph node measures 11 mm short axis on series
3, image 26. The largest left level 2 nodes are 8 mm short axis.
Other cervical lymph nodes are 6 mm short axis or less. No cystic or
necrotic node. No inflammatory stranding identified in the neck. No
fluid collection.

No osseous abnormality identified. Visualized paranasal sinuses and
mastoids are clear. Bilateral petrous apex pneumatization.
IMPRESSION: Maximal right level 2 lymph node, otherwise negative CT appearance
of the neck. Adenoid and tonsillar soft tissue within normal limits.

## 2015-05-28 ENCOUNTER — Ambulatory Visit (INDEPENDENT_AMBULATORY_CARE_PROVIDER_SITE_OTHER): Payer: 59 | Admitting: Internal Medicine

## 2015-05-28 ENCOUNTER — Encounter: Payer: Self-pay | Admitting: Internal Medicine

## 2015-05-28 VITALS — BP 128/64 | HR 105 | Temp 98.0°F | Resp 12 | Wt 165.0 lb

## 2015-05-28 DIAGNOSIS — E042 Nontoxic multinodular goiter: Secondary | ICD-10-CM

## 2015-05-28 NOTE — Progress Notes (Signed)
Patient ID: Michele Manning, female   DOB: 04-11-1965, 50 y.o.   MRN: [id number]   HPI  Michele Manning is a 50 y.o.-year-old female, returning for f/u for MNG. Last visit 1 year ago.  Reviewed hx: She had a cyst on her tonsils >> investigated by a CT scan in 09/2012 >> incidentally found a nodule >> thyroid ultrasound (01/2013): several small nodules, largest lesion 10 x 9 x 9 cm with coarse calcification, mid lobe, corresponding to CT finding  FNA of the R thyroid nodule (03/2013): benign.  I reviewed pt's thyroid tests: Lab Results  Component Value Date   TSH 3.08 04/07/2015   TSH 2.421 12/05/2013   TSH 2.330 10/22/2012    Pt denies feeling nodules in neck, hoarseness, dysphagia/odynophagia, SOB with lying down.  Pt denies: - heat intolerance/cold intolerance - tremors - palpitations - anxiety/depression - hyperdefecation/constipation - dry skin - hair falling - problems with concentration - fatigue  She does complain of: - Weight gain - Irregular menses   Pt does have a FH of thyroid ds - in mother Berenice Primas Ds.).   She also has a history of HL, IBS.  ROS: Constitutional: no weight gain/loss, no fatigue, no subjective hyperthermia/hypothermia Eyes: no blurry vision, no xerophthalmia ENT: no sore throat, no nodules palpated in throat, no dysphagia/odynophagia, no hoarseness Cardiovascular: no CP/SOB/palpitations/leg swelling Respiratory: no cough/SOB Gastrointestinal: no N/V/D/C Musculoskeletal: no muscle/joint aches Skin: no rashes Neurological: no tremors/numbness/tingling/dizziness + Irregular menses  I reviewed pt's medications, allergies, PMH, social hx, family hx, and changes were documented in the history of present illness. Otherwise, unchanged from my initial visit note.  Past Medical History  Diagnosis Date  . IBS (irritable bowel syndrome)     LACTOSE INTOLERANT  . Lactose intolerance   . Hyperlipemia   . Anxiety   . Thyroid nodule   . Allergy   .  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 Fatty infiltration of liver   . Anemia    Past Surgical History  Procedure Laterality Date  . Breast fibroadenoma surgery  05/2009    FIBROADENOMA RIGHT BREAST  . Breast biopsy Right     x 2  . Biopsy thyroid  2015   History   Social History  . 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 Smoking status: Former Smoker    Quit date: 10/23/2003  . Smokeless tobacco: Never Used  . Alcohol Use: 7.0 oz/week    14 drink(s) per week     Comment: 2-3 glasses daily  . Drug Use: No  . Sexual Activity: Yes    Partners: Male    Birth Control/ Protection: Other-see comments, Surgical     Comment: vasectomy-husband   Current Outpatient Prescriptions on File Prior to Visit  Medication Sig Dispense Refill  . Calcium Citrate-Vitamin D (CALCIUM + D PO) Take by mouth.    . Cranberry 1000 MG CAPS Take 1 capsule by mouth daily.    . fexofenadine-pseudoephedrine (ALLEGRA-D) 60-120 MG per tablet Take 1 tablet by mouth daily.     Marland Kitchen OVER THE COUNTER MEDICATION 1 tablet daily. [place]. Yeast guard    . Probiotic Product (PROBIOTIC PO) Take by mouth. "culterelle"    . Red Yeast Rice Extract (RED YEAST RICE PO) Take 1,200 mg by mouth daily.    Marland Kitchen triamcinolone (NASACORT AQ)  55 MCG/ACT AERO nasal inhaler Place 2 sprays into the nose daily as needed.    . Vitamin D, Cholecalciferol, 1000 UNITS CAPS Take 2 capsules by mouth daily.     No current facility-administered medications on file prior to visit.   No Known Allergies Family History  Problem Relation Age of Onset  . Breast cancer Maternal Grandmother     early fifties  . COPD Father   . Heart disease Father     CAD, CABG age 57  . Colon cancer Neg Hx   . Colon polyps Neg Hx   . Pancreatic cancer Neg Hx   . Prostate cancer Neg Hx   . Kidney disease Neg Hx   . Liver disease Brother      contracted Hepatitis as Forensic scientist   PE: BP 128/64 mmHg  Pulse 105  Temp(Src) 98 F (36.7 C) (Oral)  Resp 12  Wt 165 lb (74.844 kg)  SpO2 97% Body mass index is 26.42 kg/(m^2).  Wt Readings from Last 3 Encounters:  05/28/15 165 lb (74.844 kg)  04/07/15 160 lb 6.4 oz (72.757 kg)  01/14/15 159 lb 6.4 oz (72.303 kg)   Constitutional: overweight, in NAD Eyes: PERRLA, EOMI, no exophthalmos ENT: moist mucous membranes, No thyromegaly, no cervical lymphadenopathy Cardiovascular: RRR, No MRG Respiratory: CTA B Gastrointestinal: abdomen soft, NT, ND, BS+ Musculoskeletal: no deformities, strength intact in all 4;  Skin: moist, warm, no rashes Neurological: No tremor with outstretched hands, DTR normal in all 4  ASSESSMENT: 1. MNG - thyroid U/S (01/2013):   Right thyroid lobe: 47 x 14 x 15 mm. Fairly homogeneous background echotexture. Several small nodules, largest lesion 10 x 9 x 9 cm with coarse calcification, mid lobe, corresponding to CT finding. Others are all less than 4 mm.   Left thyroid lobe: 45 x 12 x 13 mm. Fairly homogeneous background echotexture. 7 x 5 x 11 mm hypoechoic solid nodule, inferior pole. Adjacent 4.6 mm hypoechoic/ cystic lesion.   Isthmus : 2.2 mm. No nodules visualized.   Lymphadenopathy: None visualized.  - 03/28/2013: FNA:  Adequacy Reason Satisfactory For Evaluation. Diagnosis THYROID, FINE NEEDLE ASPIRATION, RIGHT (SPECIMEN 1 OF 1, COLLECTED ON 03/27/13): BENIGN (BETHESDA CLASS II). FINDINGS CONSISTENT WITH BENIGN FOLLICULAR NODULE.  PLAN: 1. MNG  - we reviewed the thyroid test results and her last thyroid U/S images (and previous Bx results) with pt. - pt with a h/o goiter, with 2x 1 cm dominant nodules, one in R and one in L lobe, none large or worrisome appearing. The R nodule was Bx'ed In 03/2013, due to the presence of a calcification site >> benign. Pt does not have a thyroid cancer family history or a personal history of RxTx to  head/neck. She does not have neck compression sxs.  - we discussed to repeat her U/S in 2 years. If nodules grow >> may need Bx especially for the L nodule - OTW, we can continue to follow her on 1-2 yearly basis - her TSH was normal at last check 1.5 mo ago - I'll see her back in 2 years, but she will call me before to order the U/S so we can review it together at the time of the visit

## 2015-05-28 NOTE — Patient Instructions (Signed)
Please come back for a follow-up appt in 2 years.  Please call me 1 month before the appt to order the thyroid Ultrasound.

## 2015-11-04 ENCOUNTER — Ambulatory Visit (INDEPENDENT_AMBULATORY_CARE_PROVIDER_SITE_OTHER): Payer: Managed Care, Other (non HMO) | Admitting: Gynecology

## 2015-11-04 ENCOUNTER — Encounter: Payer: Self-pay | Admitting: Gynecology

## 2015-11-04 VITALS — BP 114/70 | Ht 66.5 in | Wt 170.0 lb

## 2015-11-04 DIAGNOSIS — Z01419 Encounter for gynecological examination (general) (routine) without abnormal findings: Secondary | ICD-10-CM | POA: Diagnosis not present

## 2015-11-04 NOTE — Progress Notes (Signed)
    Michele Manning 1965/01/17 [id number]        50 y.o.  G3P3  for annual exam.  Doing well  Past medical history,surgical history, problem list, medications, allergies, family history and social history were all reviewed and documented as reviewed in the EPIC chart.  ROS:  Performed with pertinent positives and negatives included in the history, assessment and plan.   Additional significant findings :  None   Exam: Michele Manning assistant Vitals:   11/04/15 1401  BP: 114/70  Weight: 170 lb (77.1 kg)  Height: 5' 6.5" (1.689 m)   Body mass index is 27.03 kg/m.  General appearance:  Normal affect, orientation and appearance. Skin: Grossly normal. Small seborrheic keratoses appearing lesion right anterior chest wall below breast. HEENT: Without gross lesions.  No cervical or supraclavicular adenopathy. Thyroid normal.  Lungs:  Clear without wheezing, rales or rhonchi Cardiac: RR, without RMG Abdominal:  Soft, nontender, without masses, guarding, rebound, organomegaly or hernia Breasts:  Examined lying and sitting without masses, retractions, discharge or axillary adenopathy. Pelvic:  Ext, BUS, Vagina normal  Cervix normal  Uterus anteverted, normal size, shape and contour, midline and mobile nontender   Adnexa without masses or tenderness    Anus and perineum normal   Rectovaginal normal sphincter tone without palpated masses or tenderness.    Assessment/Plan:  50 y.o. G3P3 female for annual exam with regular menses, vasectomy birth control.   1. Patient asked about small mole below her right breast. Appears to be a classic seborrheic keratoses. No concerning changes as far as color or irregularity. Recommended patient watch for now and if it changes at all follow up with dermatology. 2. Mammography 03/2015. Continue with annual mammography when due. SBE monthly reviewed. 3. Pap smear/HPV 2015 negative. No Pap smear done today. No history of abnormal Pap smears previously. Plan  repeat Pap smear at 5 year interval per current screening guidelines. 4. Health maintenance. No routine lab work done as patient reports this done elsewhere. Follow up 1 year, sooner as needed.   Michele Auerbach MD, 2:18 PM 11/04/2015

## 2015-11-04 NOTE — Patient Instructions (Signed)

## 2016-04-12 ENCOUNTER — Encounter: Payer: 59 | Admitting: Family

## 2016-04-29 ENCOUNTER — Encounter: Payer: 59 | Admitting: Family

## 2016-11-04 ENCOUNTER — Encounter: Payer: Self-pay | Admitting: Gynecology

## 2016-11-04 ENCOUNTER — Ambulatory Visit (INDEPENDENT_AMBULATORY_CARE_PROVIDER_SITE_OTHER): Payer: Managed Care, Other (non HMO) | Admitting: Gynecology

## 2016-11-04 VITALS — BP 122/80 | Ht 66.0 in | Wt 171.0 lb

## 2016-11-04 DIAGNOSIS — F419 Anxiety disorder, unspecified: Secondary | ICD-10-CM | POA: Diagnosis not present

## 2016-11-04 DIAGNOSIS — Z01419 Encounter for gynecological examination (general) (routine) without abnormal findings: Secondary | ICD-10-CM | POA: Diagnosis not present

## 2016-11-04 MED ORDER — FLUOXETINE HCL 20 MG PO CAPS: 20.0000 mg | ORAL_CAPSULE | Freq: Every day | ORAL | 11 refills | Status: DC

## 2016-11-04 NOTE — Progress Notes (Signed)
    Michele Manning 1965/08/29 [id number]        51 y.o.  G3P3 for annual gynecologic exam.  Also is having a lot of issues with anxiety sleeping that she wants to talk to me about.  Father passed away this past year and she is having some difficulty dealing with this.  Has been doing a lot of running back and forth to [place] during his illness.  Now finding that simple situations make her anxious as well as having difficulties falling asleep at night.  No significant weight changes.  No hair or skin nausea vomiting diarrhea constipation.  Past medical history,surgical history, problem list, medications, allergies, family history and social history were all reviewed and documented as reviewed in the EPIC chart.  ROS:  Performed with pertinent positives and negatives included in the history, assessment and plan.   Additional significant findings : None   Exam: Wandra Scot assistant Vitals:   11/04/16 1356  BP: 122/80  Weight: 171 lb (77.6 kg)  Height: 5\' 6"  (1.676 m)   Body mass index is 27.6 kg/m.  General appearance:  Normal affect, orientation and appearance. Skin: Grossly normal HEENT: Without gross lesions.  No cervical or supraclavicular adenopathy. Thyroid normal.  Lungs:  Clear without wheezing, rales or rhonchi Cardiac: RR, without RMG Abdominal:  Soft, nontender, without masses, guarding, rebound, organomegaly or hernia Breasts:  Examined lying and sitting without masses, retractions, discharge or axillary adenopathy. Pelvic:  Ext, BUS, Vagina: Normal  Cervix: Normal  Uterus: Anteverted, normal size, shape and contour, midline and mobile nontender   Adnexa: Without masses or tenderness    Anus and perineum: Normal   Rectovaginal: Normal sphincter tone without palpated masses or tenderness.    Assessment/Plan:  51 y.o. G3P3 female for annual gynecologic exam with regular menses, vasectomy birth control.   1. Anxiety and difficulty sleeping since death of her father.  No  significant depression/crying spells or suicidal ideation.  No weight changes.  Discussed options for management to include observation, counseling, medication.  I recommended that she consider starting on medication for now since this is clearly a situational anxiety/insomnia and I think that over time we would be able to wean her back off of this.  We both agreed to go ahead and initiate fluoxetine 20 mg daily.  Side effect profile reviewed and risks to include worsening depression/suicidal ideation.  She will call after month or so if she is not feeling adequate response will increase her to 30 mg.  Refill for the 20 mg x 1 year was provided. 2. Mammography 03/2015.  Patient knows she is overdue.  Plans to schedule.  Breast exam normal today.  SBE monthly reviewed. 3. Pap smear/HPV 2015.  No Pap smear done today.  No history of abnormal Pap smears.  Plan repeat Pap smear at 5-year interval per current screening guidelines. 4. Health maintenance.  No routine lab work done as patient plans to do this when she establishes care with a new primary physician.  Follow-up in 1 year, sooner if any issues with her anxiety medication medication.   Anastasio Auerbach MD, 2:29 PM 11/04/2016

## 2016-11-04 NOTE — Patient Instructions (Signed)
Start on the anxiety medication as we discussed.  Call me if you have any issues with this.  Schedule your mammogram  Follow-up in 1 year for annual exam

## 2017-04-06 ENCOUNTER — Other Ambulatory Visit: Payer: Self-pay | Admitting: Family

## 2017-04-06 ENCOUNTER — Other Ambulatory Visit: Payer: Self-pay | Admitting: Gynecology

## 2017-04-06 DIAGNOSIS — Z1231 Encounter for screening mammogram for malignant neoplasm of breast: Secondary | ICD-10-CM

## 2017-04-17 ENCOUNTER — Ambulatory Visit: Payer: Managed Care, Other (non HMO) | Admitting: Gynecology

## 2017-04-17 ENCOUNTER — Encounter: Payer: Self-pay | Admitting: Gynecology

## 2017-04-17 VITALS — BP 116/74

## 2017-04-17 DIAGNOSIS — N9089 Other specified noninflammatory disorders of vulva and perineum: Secondary | ICD-10-CM

## 2017-04-17 NOTE — Progress Notes (Signed)
    YARDLEY BELTRAN Sep 14, 1965 [id number]        52 y.o.  Octavio Graves presents having found a small bump on the outside of her vagina.  Noticed when she was applying antifungal cream for vaginal discharge.  No history of same before.  Area does not hurt or cause other symptoms.    Past medical history,surgical history, problem list, medications, allergies, family history and social history were all reviewed and documented in the EPIC chart.  Directed ROS with pertinent positives and negatives documented in the history of present illness/assessment and plan.  Exam: [name] assistant Vitals:   04/17/17 1438  BP: 116/74   General appearance:  Normal External BUS vagina with small raised papule right of the clitoral hood.  No other vulvar lesions noted.  Vagina without lesions.  Physical Exam  Genitourinary:      Procedure: The skin overlying and surrounding the lesion was cleansed with Betadine and subsequently infiltrated with 1% lidocaine.  The lesion was excised in it's entirety to the level of the normal-appearing surrounding skin.  Silver nitrate hemostasis applied.  Postop instructions given.  Specimen sent to pathology.  Assessment/Plan:  52 y.o. G3P3 with a small raised skin lesion.  Questionable small condyloma versus skin papule.  Benign in appearance.  Options for observation versus excision discussed and the patient prefers excision as noted above.  Patient will follow-up for biopsy results in several days.    Anastasio Auerbach MD, 2:59 PM 04/17/2017

## 2017-04-17 NOTE — Patient Instructions (Signed)
Office will call you with biopsy results 

## 2017-04-19 LAB — PATHOLOGY

## 2017-04-19 LAB — TISSUE SPECIMEN

## 2017-04-27 ENCOUNTER — Ambulatory Visit
Admission: RE | Admit: 2017-04-27 | Discharge: 2017-04-27 | Disposition: A | Discharge status: Home / Self Care | Payer: 59 | Source: Ambulatory Visit | Attending: Gynecology | Admitting: Gynecology

## 2017-04-27 DIAGNOSIS — Z1231 Encounter for screening mammogram for malignant neoplasm of breast: Secondary | ICD-10-CM

## 2017-04-28 ENCOUNTER — Other Ambulatory Visit: Payer: Self-pay | Admitting: Gynecology

## 2017-04-28 DIAGNOSIS — R928 Other abnormal and inconclusive findings on diagnostic imaging of breast: Secondary | ICD-10-CM

## 2017-05-02 ENCOUNTER — Ambulatory Visit
Admission: RE | Admit: 2017-05-02 | Discharge: 2017-05-02 | Disposition: A | Discharge status: Home / Self Care | Payer: Managed Care, Other (non HMO) | Source: Ambulatory Visit | Attending: Gynecology | Admitting: Gynecology

## 2017-05-02 ENCOUNTER — Ambulatory Visit: Payer: Managed Care, Other (non HMO)

## 2017-05-02 DIAGNOSIS — R928 Other abnormal and inconclusive findings on diagnostic imaging of breast: Secondary | ICD-10-CM

## 2017-11-02 ENCOUNTER — Telehealth: Payer: Self-pay | Admitting: *Deleted

## 2017-11-02 MED ORDER — FLUOXETINE HCL 20 MG PO CAPS: 20.0000 mg | ORAL_CAPSULE | Freq: Every day | ORAL | 0 refills | Status: DC

## 2017-11-02 NOTE — Addendum Note (Signed)
Addended by: Thamas Jaegers on: 11/02/2017 10:49 AM   Modules accepted: Orders

## 2017-11-02 NOTE — Telephone Encounter (Signed)
Patient has annual scheduled on 11/13/17, needs refill on Prozac 20 mg

## 2017-11-02 NOTE — Telephone Encounter (Signed)
Rx sent. Patient informed. 

## 2017-11-02 NOTE — Telephone Encounter (Signed)
Okay for #30 no refill

## 2017-11-06 ENCOUNTER — Encounter: Payer: Managed Care, Other (non HMO) | Admitting: Gynecology

## 2017-11-13 ENCOUNTER — Encounter: Payer: Self-pay | Admitting: Gynecology

## 2017-11-13 ENCOUNTER — Ambulatory Visit: Payer: Managed Care, Other (non HMO) | Admitting: Gynecology

## 2017-11-13 VITALS — BP 120/76 | Ht 67.0 in | Wt 176.0 lb

## 2017-11-13 DIAGNOSIS — Z01419 Encounter for gynecological examination (general) (routine) without abnormal findings: Secondary | ICD-10-CM | POA: Diagnosis not present

## 2017-11-13 DIAGNOSIS — Z1151 Encounter for screening for human papillomavirus (HPV): Secondary | ICD-10-CM | POA: Diagnosis not present

## 2017-11-13 MED ORDER — FLUOXETINE HCL 20 MG PO CAPS: 20.0000 mg | ORAL_CAPSULE | Freq: Every day | ORAL | 4 refills | Status: DC

## 2017-11-13 NOTE — Progress Notes (Signed)
    Michele Manning March 27, 1965 [id number]        52 y.o.  G3P3 for annual gynecologic exam.  Without gynecologic complaints.  Had small vulvar skin papule excised earlier this year.  Has noticed no further abnormalities on self vulvar exam.  Past medical history,surgical history, problem list, medications, allergies, family history and social history were all reviewed and documented as reviewed in the EPIC chart.  ROS:  Performed with pertinent positives and negatives included in the history, assessment and plan.   Additional significant findings : None   Exam: [name] assistant Vitals:   11/13/17 0845  BP: 120/76  Weight: 176 lb (79.8 kg)  Height: 5\' 7"  (1.702 m)   Body mass index is 27.57 kg/m.  General appearance:  Normal affect, orientation and appearance. Skin: Grossly normal HEENT: Without gross lesions.  No cervical or supraclavicular adenopathy. Thyroid normal.  Lungs:  Clear without wheezing, rales or rhonchi Cardiac: RR, without RMG Abdominal:  Soft, nontender, without masses, guarding, rebound, organomegaly or hernia Breasts:  Examined lying and sitting without masses, retractions, discharge or axillary adenopathy. Pelvic:  Ext, BUS, Vagina: Normal  Cervix: Normal.  Pap smear/HPV  Uterus: Anteverted, normal size, shape and contour, midline and mobile nontender   Adnexa: Without masses or tenderness    Anus and perineum: Normal   Rectovaginal: Normal sphincter tone without palpated masses or tenderness.    Assessment/Plan:  52 y.o. G3P3 female for annual gynecologic exam with regular menses, vasectomy birth control.   1. Perimenopause.  Patient continues with regular menses.  No menopausal symptoms.  We discussed what to expect in the perimenopause.  She will keep a menstrual calendar and report any significant irregularity or significant menopausal symptoms. 2. Anxiety/insomnia.  Started on fluoxetine last year following the death of her father.  Has done well with  this and wants to continue for now.  Refill x1 year provided. 3. Mammography 04/2017.  Continue with annual mammography when due.  Breast exam normal today. 4. Pap smear/HPV 10/2013.  Pap smear/HPV today.  No history of significant abnormal Pap smears. 5. Colonoscopy 2012.  As she is turned 83 I asked her to call her gastroenterologist just to check to make sure when they want to repeat her colonoscopy. 6. Health maintenance.  No routine lab work done as patient reports this done elsewhere.  Follow-up 1 year, sooner as needed.   Anastasio Auerbach MD, 9:21 AM 11/13/2017

## 2017-11-13 NOTE — Addendum Note (Signed)
Addended by: Nelva Nay on: 11/13/2017 10:30 AM   Modules accepted: Orders

## 2017-11-13 NOTE — Patient Instructions (Signed)
Follow-up in 1 year for annual exam, sooner if any issues. 

## 2017-11-15 LAB — PAP IG AND HPV HIGH-RISK: HPV DNA HIGH RISK: NOT DETECTED

## 2017-12-02 ENCOUNTER — Other Ambulatory Visit: Payer: Self-pay | Admitting: Gynecology

## 2018-09-11 ENCOUNTER — Other Ambulatory Visit: Payer: Self-pay | Admitting: Gynecology

## 2018-09-11 DIAGNOSIS — Z1231 Encounter for screening mammogram for malignant neoplasm of breast: Secondary | ICD-10-CM

## 2018-09-26 ENCOUNTER — Encounter: Payer: Self-pay | Admitting: Gynecology

## 2018-10-24 ENCOUNTER — Ambulatory Visit
Admission: RE | Admit: 2018-10-24 | Discharge: 2018-10-24 | Disposition: A | Discharge status: Home / Self Care | Payer: Managed Care, Other (non HMO) | Source: Ambulatory Visit | Attending: Gynecology | Admitting: Gynecology

## 2018-10-24 ENCOUNTER — Other Ambulatory Visit: Payer: Self-pay

## 2018-10-24 DIAGNOSIS — Z1231 Encounter for screening mammogram for malignant neoplasm of breast: Secondary | ICD-10-CM

## 2018-11-14 ENCOUNTER — Other Ambulatory Visit: Payer: Self-pay

## 2018-11-15 ENCOUNTER — Ambulatory Visit (INDEPENDENT_AMBULATORY_CARE_PROVIDER_SITE_OTHER): Payer: Managed Care, Other (non HMO) | Admitting: Gynecology

## 2018-11-15 ENCOUNTER — Encounter: Payer: Self-pay | Admitting: Gynecology

## 2018-11-15 VITALS — BP 114/70 | Ht 67.0 in | Wt 187.0 lb

## 2018-11-15 DIAGNOSIS — Z01419 Encounter for gynecological examination (general) (routine) without abnormal findings: Secondary | ICD-10-CM | POA: Diagnosis not present

## 2018-11-15 DIAGNOSIS — N951 Menopausal and female climacteric states: Secondary | ICD-10-CM

## 2018-11-15 NOTE — Progress Notes (Signed)
    Michele Manning 08-May-1965 [id number]        53 y.o.  G3P3 for annual gynecologic exam.  Notes over the past year that her menses are coming closer together.  No significant hot flushes or sweats.  Currently on fluoxetine that is managed by her primary physician.  Past medical history,surgical history, problem list, medications, allergies, family history and social history were all reviewed and documented as reviewed in the EPIC chart.  ROS:  Performed with pertinent positives and negatives included in the history, assessment and plan.   Additional significant findings : None   Exam: [name] assistant Vitals:   11/15/18 1355  BP: 114/70  Weight: 187 lb (84.8 kg)  Height: 5\' 7"  (1.702 m)   Body mass index is 29.29 kg/m.  General appearance:  Normal affect, orientation and appearance. Skin: Grossly normal HEENT: Without gross lesions.  No cervical or supraclavicular adenopathy. Thyroid normal.  Lungs:  Clear without wheezing, rales or rhonchi Cardiac: RR, without RMG Abdominal:  Soft, nontender, without masses, guarding, rebound, organomegaly or hernia Breasts:  Examined lying and sitting without masses, retractions, discharge or axillary adenopathy. Pelvic:  Ext, BUS, Vagina: Normal  Cervix: Normal  Uterus: Anteverted, normal size, shape and contour, midline and mobile nontender   Adnexa: Without masses or tenderness    Anus and perineum: Normal   Rectovaginal: Normal sphincter tone without palpated masses or tenderness.    Assessment/Plan:  53 y.o. G3P3 female for annual gynecologic exam.  With monthly menses vasectomy birth control  1. Perimenopausal.  Patient notes her menses are coming closer together.  No significant menopausal symptoms otherwise.  We discussed what to expect in the perimenopausal timeframe.  We will follow-up if she would have significant irregular bleeding or develop significant menopausal symptoms.  We discussed options to include OTC products as  well as HRT. 2. Pap smear/HPV 2019.  No Pap smear done today.  No history of significant abnormal Pap smears.  Plan repeat Pap smear/HPV at 5-year interval per current screening guidelines. 3. Colonoscopy 2012.  We will plan repeat colonoscopy over the next year or 2 4. Mammography 10/2018.  Continue with annual mammography when due.  Breast exam normal today. 5. Health maintenance.  No routine lab work done as patient does this elsewhere.  Follow-up 1 more, sooner as needed.   Anastasio Auerbach MD, 3:22 PM 11/15/2018

## 2018-11-15 NOTE — Patient Instructions (Signed)
Follow-up in 1 year for annual exam, sooner as needed. 

## 2019-03-02 ENCOUNTER — Ambulatory Visit: Payer: Managed Care, Other (non HMO)

## 2019-05-24 ENCOUNTER — Other Ambulatory Visit: Payer: Self-pay

## 2019-05-24 ENCOUNTER — Ambulatory Visit: Payer: Managed Care, Other (non HMO) | Admitting: Obstetrics & Gynecology

## 2019-05-24 ENCOUNTER — Encounter: Payer: Self-pay | Admitting: Obstetrics & Gynecology

## 2019-05-24 VITALS — BP 120/70

## 2019-05-24 DIAGNOSIS — L900 Lichen sclerosus et atrophicus: Secondary | ICD-10-CM | POA: Diagnosis not present

## 2019-05-24 DIAGNOSIS — N904 Leukoplakia of vulva: Secondary | ICD-10-CM | POA: Diagnosis not present

## 2019-05-24 DIAGNOSIS — L292 Pruritus vulvae: Secondary | ICD-10-CM

## 2019-05-24 DIAGNOSIS — N9089 Other specified noninflammatory disorders of vulva and perineum: Secondary | ICD-10-CM | POA: Diagnosis not present

## 2019-05-24 LAB — WET PREP FOR TRICH, YEAST, CLUE

## 2019-05-24 MED ORDER — CLOBETASOL PROPIONATE 0.05 % EX CREA: 1.0000 "application " | TOPICAL_CREAM | Freq: Two times a day (BID) | CUTANEOUS | 1 refills | Status: AC

## 2019-05-24 NOTE — Progress Notes (Signed)
    Michele Manning February 09, 1965 [id number]        54 y.o.  G3P3L3   RP: Vulvar itching x 2 weeks  HPI: Vulvar itching x 2 weeks.  Treated with Monistat twice and continues to itch at the vulva.  Husband looked and said it was white and flaky.  No odor.  No pelvic pain.  No UTI Sx.  No fever.   OB History  Gravida Para Term Preterm AB Living  3 3       3   SAB TAB Ectopic Multiple Live Births               # Outcome Date GA Lbr Len/2nd Weight Sex Delivery Anes PTL Lv  3 Para           2 Para           1 Para             Past medical history,surgical history, problem list, medications, allergies, family history and social history were all reviewed and documented in the EPIC chart.   Directed ROS with pertinent positives and negatives documented in the history of present illness/assessment and plan.  Exam:  Vitals:   05/24/19 1428  BP: 120/70   General appearance:  Normal   Gynecologic exam: Vulva:  White atrophy at mid anterior vulva.  Speculum:  Cervix/vagina normal.  Normal secretions. Wet prep done.  Wet prep negative   Assessment/Plan:  54 y.o. G3P3   1. Vulvar itching Yeast vaginitis r/o with a negative Wet prep. - WET PREP FOR [place], YEAST, CLUE  2. Vulvar lesion White lesion about 1 x 1.5 cm at mid anterior vulva.  Probably early Lichen Sclerosus.  Will treat with Clobetasol.  If not improved at f/u in 4 weeks, will take a vulvar biopsy to r/o dysplasia or other pathology.  3. Lichen sclerosus et atrophicus of the vulva Probable early Lichen sclerosus.  Diagnosis discussed with patient.  Counseling done.  Will treat with Clobetasol 0.05% cream.  Thin application on the affected vulva twice a day for the 1st week, then daily the second week, 3 times the 3rd week and twice during the 4th week.  F/U at 4 weeks to reassess and perform a vulvar Bx as needed.  Other orders - clobetasol cream (TEMOVATE) 0.05 %; Apply 1 application topically 2 (two) times daily for 7  days. Then once daily x 7 days, 3 times/week x 7 days and twice/week x 7 days.  Thin application on affected vulva.  Princess Bruins MD, 2:33 PM 05/24/2019

## 2019-05-24 NOTE — Patient Instructions (Signed)
1. Vulvar itching Yeast vaginitis r/o with a negative Wet prep. - WET PREP FOR [place], YEAST, CLUE  2. Vulvar lesion White lesion about 1 x 1.5 cm at mid anterior vulva.  Probably early Lichen Sclerosus.  Will treat with Clobetasol.  If not improved at f/u in 4 weeks, will take a vulvar biopsy to r/o dysplasia or other pathology.  3. Lichen sclerosus et atrophicus of the vulva Probable early Lichen sclerosus.  Diagnosis discussed with patient.  Counseling done.  Will treat with Clobetasol 0.05% cream.  Thin application on the affected vulva twice a day for the 1st week, then daily the second week, 3 times the 3rd week and twice during the 4th week.  F/U at 4 weeks to reassess and perform a vulvar Bx as needed.  Other orders - clobetasol cream (TEMOVATE) 0.05 %; Apply 1 application topically 2 (two) times daily for 7 days. Then once daily x 7 days, 3 times/week x 7 days and twice/week x 7 days.  Thin application on affected vulva.  Vlada, it was a pleasure seeing you today!

## 2019-06-17 ENCOUNTER — Other Ambulatory Visit: Payer: Self-pay

## 2019-06-19 ENCOUNTER — Other Ambulatory Visit: Payer: Self-pay

## 2019-06-19 ENCOUNTER — Ambulatory Visit: Payer: Managed Care, Other (non HMO) | Admitting: Obstetrics & Gynecology

## 2019-06-19 ENCOUNTER — Encounter: Payer: Self-pay | Admitting: Obstetrics & Gynecology

## 2019-06-19 VITALS — BP 136/82

## 2019-06-19 DIAGNOSIS — N904 Leukoplakia of vulva: Secondary | ICD-10-CM

## 2019-06-19 MED ORDER — CLOBETASOL PROPIONATE 0.05 % EX CREA: 1.0000 "application " | TOPICAL_CREAM | CUTANEOUS | 4 refills | Status: DC

## 2019-06-19 NOTE — Progress Notes (Signed)
    Michele Manning 17-Dec-1965 [id number]        54 y.o.  A2N0N3  RP: F/U Lichen Sclerosus   HPI: Seen on May 24, 2019 with vulvar itching and white atrophy of the anterior vulva compatible with early lichen sclerosus of the vulva..  Started on clobetasol cream 0.05%.  No more vulvar itching.  Patient feeling much better.   OB History  Gravida Para Term Preterm AB Living  3 3       3   SAB TAB Ectopic Multiple Live Births               # Outcome Date GA Lbr Len/2nd Weight Sex Delivery Anes PTL Lv  3 Para           2 Para           1 Para             Past medical history,surgical history, problem list, medications, allergies, family history and social history were all reviewed and documented in the EPIC chart.   Directed ROS with pertinent positives and negatives documented in the history of present illness/assessment and plan.  Exam:  Vitals:   06/19/19 1435  BP: 136/82   General appearance:  Normal  Gynecologic exam: Vulva:  Anterior vulva with white atrophy c/w Lichen Sclerosus with mild improvement since last visit.     Assessment/Plan:  54 y.o. G3P3   1. Lichen sclerosus et atrophicus of the vulva Lichen sclerosus of the vulva responding well to clobetasol cream treatment.  Currently asymptomatic and the white atrophy was stable to slightly improved.  We will continue with clobetasol cream 9.76% a thin application on the affected vulva twice a week.  Follow-up at annual gynecologic visit.  Other orders - clobetasol cream (TEMOVATE) 0.05 %; Apply 1 application topically 2 (two) times a week. Thin application on affected vulva.  Princess Bruins MD, 2:59 PM 06/19/2019

## 2019-06-19 NOTE — Patient Instructions (Signed)
1. Lichen sclerosus et atrophicus of the vulva Lichen sclerosus of the vulva responding well to clobetasol cream treatment.  Currently asymptomatic and the white atrophy was stable to slightly improved.  We will continue with clobetasol cream 0.22% a thin application on the affected vulva twice a week.  Follow-up at annual gynecologic visit.  Other orders - clobetasol cream (TEMOVATE) 0.05 %; Apply 1 application topically 2 (two) times a week. Thin application on affected vulva.  Michele Manning, it was a pleasure seeing you today!

## 2019-10-21 ENCOUNTER — Other Ambulatory Visit: Payer: Self-pay | Admitting: Obstetrics & Gynecology

## 2019-10-21 ENCOUNTER — Other Ambulatory Visit: Payer: Self-pay | Admitting: Psychiatry

## 2019-10-21 DIAGNOSIS — Z1231 Encounter for screening mammogram for malignant neoplasm of breast: Secondary | ICD-10-CM

## 2019-11-18 ENCOUNTER — Ambulatory Visit
Admission: RE | Admit: 2019-11-18 | Discharge: 2019-11-18 | Disposition: A | Discharge status: Home / Self Care | Payer: Managed Care, Other (non HMO) | Source: Ambulatory Visit | Attending: Obstetrics & Gynecology | Admitting: Obstetrics & Gynecology

## 2019-11-18 ENCOUNTER — Encounter: Payer: Managed Care, Other (non HMO) | Admitting: Gynecology

## 2019-11-18 ENCOUNTER — Encounter: Payer: Managed Care, Other (non HMO) | Admitting: Obstetrics and Gynecology

## 2019-11-18 ENCOUNTER — Other Ambulatory Visit: Payer: Self-pay

## 2019-11-18 DIAGNOSIS — Z1231 Encounter for screening mammogram for malignant neoplasm of breast: Secondary | ICD-10-CM

## 2019-12-10 ENCOUNTER — Encounter: Payer: Self-pay | Admitting: Obstetrics & Gynecology

## 2019-12-10 ENCOUNTER — Other Ambulatory Visit: Payer: Self-pay

## 2019-12-10 ENCOUNTER — Ambulatory Visit (INDEPENDENT_AMBULATORY_CARE_PROVIDER_SITE_OTHER): Payer: Managed Care, Other (non HMO) | Admitting: Obstetrics & Gynecology

## 2019-12-10 VITALS — BP 126/78 | Ht 65.5 in | Wt 171.0 lb

## 2019-12-10 DIAGNOSIS — N920 Excessive and frequent menstruation with regular cycle: Secondary | ICD-10-CM

## 2019-12-10 DIAGNOSIS — Z01419 Encounter for gynecological examination (general) (routine) without abnormal findings: Secondary | ICD-10-CM | POA: Diagnosis not present

## 2019-12-10 DIAGNOSIS — N904 Leukoplakia of vulva: Secondary | ICD-10-CM | POA: Diagnosis not present

## 2019-12-10 DIAGNOSIS — Z9189 Other specified personal risk factors, not elsewhere classified: Secondary | ICD-10-CM

## 2019-12-10 NOTE — Progress Notes (Signed)
Michele Manning May 19, 1965 [id number]   History:    54 y.o. G3P3L3 Married.  Vasectomy  RP:  Established patient presenting for Annual Gynecologic exam  HPI:  Heavy menses lasting more than 10 days every month.  No BTB.  No pelvic pain.  No pain with IC.  Lichen sclerosus of the vulva on Clobetasol cream.  Treatment has helped, but some burning when applying the cream recently, better with once a week application.  Urine/BMs normal.  Breasts normal.  BMI 28.02.  Lost 58 Lbs recently with Noom.  Health labs with [facility] NP.   Past medical history,surgical history, family history and social history were all reviewed and documented in the EPIC chart.  Gynecologic History Patient's last menstrual period was 11/12/2019.  Obstetric History OB History  Gravida Para Term Preterm AB Living  3 3       3   SAB TAB Ectopic Multiple Live Births               # Outcome Date GA Lbr Len/2nd Weight Sex Delivery Anes PTL Lv  3 Para           2 Para           1 Para              ROS: A ROS was performed and pertinent positives and negatives are included in the history.  GENERAL: No fevers or chills. HEENT: No change in vision, no earache, sore throat or sinus congestion. NECK: No pain or stiffness. CARDIOVASCULAR: No chest pain or pressure. No palpitations. PULMONARY: No shortness of breath, cough or wheeze. GASTROINTESTINAL: No abdominal pain, nausea, vomiting or diarrhea, melena or bright red blood per rectum. GENITOURINARY: No urinary frequency, urgency, hesitancy or dysuria. MUSCULOSKELETAL: No joint or muscle pain, no back pain, no recent trauma. DERMATOLOGIC: No rash, no itching, no lesions. ENDOCRINE: No polyuria, polydipsia, no heat or cold intolerance. No recent change in weight. HEMATOLOGICAL: No anemia or easy bruising or bleeding. NEUROLOGIC: No headache, seizures, numbness, tingling or weakness. PSYCHIATRIC: No depression, no loss of interest in normal activity or change in sleep pattern.      Exam:   BP 126/78   Ht 5' 5.5" (1.664 m)   Wt 171 lb (77.6 kg)   LMP 11/12/2019   BMI 28.02 kg/m   Body mass index is 28.02 kg/m.  General appearance : Well developed well nourished female. No acute distress HEENT: Eyes: no retinal hemorrhage or exudates,  Neck supple, trachea midline, no carotid bruits, no thyroidmegaly Lungs: Clear to auscultation, no rhonchi or wheezes, or rib retractions  Heart: Regular rate and rhythm, no murmurs or gallops Breast:Examined in sitting and supine position were symmetrical in appearance, no palpable masses or tenderness,  no skin retraction, no nipple inversion, no nipple discharge, no skin discoloration, no axillary or supraclavicular lymphadenopathy Abdomen: no palpable masses or tenderness, no rebound or guarding Extremities: no edema or skin discoloration or tenderness  Pelvic: Vulva: Normal             Vagina: No gross lesions or discharge  Cervix: No gross lesions or discharge  Uterus  AV, normal size, shape and consistency, non-tender and mobile  Adnexa  Without masses or tenderness  Anus: Normal   Assessment/Plan:  54 y.o. female for annual exam   1. Well female exam with routine gynecological exam Normal gynecologic exam.  Pap test negative in 2019, will repeat next year at 3 years.  Breast exam normal.  Last mammogram November 2021 was benign.  Colonoscopy in 2012, will repeat in 2022.  Health labs with family NP.  Improved body mass index at 28.02.  Patient lost 30 pounds on Noom, will continue.  2. Relies on partner vasectomy for contraception  3. Menorrhagia with regular cycle Heavy menses lasting >10 days every month.  R/O Anemia and thyroid dysfunction.  F/U Pelvic US to investigate further.  Management per findings. - CBC - TSH - US Transvaginal Non-OB; Future  4. Lichen sclerosus et atrophicus of the vulva Mild lichen sclerosus with white atrophy at anterior vulva.  Will continue with Clobetasol cream 0.05% just at  that location once a week.    Princess Bruins MD, 4:42 PM 12/10/2019

## 2019-12-12 LAB — CBC
HCT: 41.9 % (ref 35.0–45.0)
Hemoglobin: 13.5 g/dL (ref 11.7–15.5)
MCH: 27.3 pg (ref 27.0–33.0)
MCHC: 32.2 g/dL (ref 32.0–36.0)
MCV: 84.8 fL (ref 80.0–100.0)
MPV: 10.2 fL (ref 7.5–12.5)
Platelets: 298 10*3/uL (ref 140–400)
RBC: 4.94 10*6/uL (ref 3.80–5.10)
RDW: 13 % (ref 11.0–15.0)
WBC: 8.6 10*3/uL (ref 3.8–10.8)

## 2019-12-12 LAB — TSH: TSH: 2.63 mIU/L

## 2020-01-02 ENCOUNTER — Encounter: Payer: Managed Care, Other (non HMO) | Admitting: Obstetrics & Gynecology

## 2020-01-16 ENCOUNTER — Other Ambulatory Visit: Payer: Self-pay

## 2020-01-16 ENCOUNTER — Encounter: Payer: Self-pay | Admitting: Obstetrics & Gynecology

## 2020-01-16 ENCOUNTER — Ambulatory Visit (INDEPENDENT_AMBULATORY_CARE_PROVIDER_SITE_OTHER): Payer: Managed Care, Other (non HMO)

## 2020-01-16 ENCOUNTER — Ambulatory Visit (INDEPENDENT_AMBULATORY_CARE_PROVIDER_SITE_OTHER): Payer: Managed Care, Other (non HMO) | Admitting: Obstetrics & Gynecology

## 2020-01-16 DIAGNOSIS — N920 Excessive and frequent menstruation with regular cycle: Secondary | ICD-10-CM

## 2020-01-16 DIAGNOSIS — Z9189 Other specified personal risk factors, not elsewhere classified: Secondary | ICD-10-CM

## 2020-01-16 NOTE — Progress Notes (Signed)
    Michele Manning March 09, 1965 [id number]        55 y.o.  G3P3L3  RP: Menorrhagia for Pelvic US  HPI: Heavy menses lasting more than 10 days every month.  No BTB.  No pelvic pain.  No pain with IC.     OB History  Gravida Para Term Preterm AB Living  3 3       3   SAB IAB Ectopic Multiple Live Births               # Outcome Date GA Lbr Len/2nd Weight Sex Delivery Anes PTL Lv  3 Para           2 Para           1 Para             Past medical history,surgical history, problem list, medications, allergies, family history and social history were all reviewed and documented in the EPIC chart.   Directed ROS with pertinent positives and negatives documented in the history of present illness/assessment and plan.  Exam:  There were no vitals filed for this visit. General appearance:  Normal  Pelvic US today: T/V images.  Anteverted uterus normal in size and shape with no myometrial mass.  The uterus is measured at 7.77 x 5.12 x 3.96 cm.  The endometrial lining is symmetrical and tri-layered at 7.3 mm with no mass or thickening seen.  Both ovaries are normal with a left ovarian follicle measured at 8 mm.  No adnexal mass seen.  No free fluid in the posterior cul-de-sac.   Assessment/Plan:  55 y.o. G3P3   1. Menorrhagia with regular cycle No menses since 11/2019.  Hb normal at 13.5 on 12/10/2019.  Pelvic US findings thoroughly reviewed with patient.  Normal uterus and endometrial line tri-layered normal at 7.3 mm.  Normal ovaries with a Lt ovarian follicle at 8 mm.  Patient reassured.  Will observe.  If no menses x 3 months and no Menopausal Sx, will start Cyclic Provera 5 mg x 10 days every 3 months.  2. Relies on partner vasectomy for contraception  Princess Bruins MD, 4:07 PM 01/16/2020

## 2020-10-07 ENCOUNTER — Encounter: Payer: Self-pay | Admitting: Gastroenterology

## 2020-12-10 ENCOUNTER — Ambulatory Visit: Payer: 59 | Admitting: Obstetrics & Gynecology

## 2021-01-06 ENCOUNTER — Other Ambulatory Visit: Payer: Self-pay | Admitting: Obstetrics & Gynecology

## 2021-01-06 DIAGNOSIS — Z1231 Encounter for screening mammogram for malignant neoplasm of breast: Secondary | ICD-10-CM

## 2021-02-08 ENCOUNTER — Ambulatory Visit: Payer: Managed Care, Other (non HMO)

## 2021-02-09 ENCOUNTER — Ambulatory Visit
Admission: RE | Admit: 2021-02-09 | Discharge: 2021-02-09 | Disposition: A | Discharge status: Home / Self Care | Payer: Managed Care, Other (non HMO) | Source: Ambulatory Visit | Attending: Obstetrics & Gynecology | Admitting: Obstetrics & Gynecology

## 2021-02-09 DIAGNOSIS — Z1231 Encounter for screening mammogram for malignant neoplasm of breast: Secondary | ICD-10-CM

## 2021-02-15 ENCOUNTER — Encounter: Payer: Self-pay | Admitting: Obstetrics & Gynecology

## 2021-02-15 ENCOUNTER — Other Ambulatory Visit (HOSPITAL_COMMUNITY)
Admission: RE | Admit: 2021-02-15 | Discharge: 2021-02-15 | Disposition: A | Discharge status: Home / Self Care | Payer: 59 | Source: Ambulatory Visit | Attending: Obstetrics & Gynecology | Admitting: Obstetrics & Gynecology

## 2021-02-15 ENCOUNTER — Other Ambulatory Visit: Payer: Self-pay

## 2021-02-15 ENCOUNTER — Ambulatory Visit (INDEPENDENT_AMBULATORY_CARE_PROVIDER_SITE_OTHER): Payer: 59 | Admitting: Obstetrics & Gynecology

## 2021-02-15 VITALS — BP 138/78 | HR 102 | Ht 66.5 in | Wt 187.0 lb

## 2021-02-15 DIAGNOSIS — Z9189 Other specified personal risk factors, not elsewhere classified: Secondary | ICD-10-CM

## 2021-02-15 DIAGNOSIS — N951 Menopausal and female climacteric states: Secondary | ICD-10-CM | POA: Diagnosis not present

## 2021-02-15 DIAGNOSIS — E663 Overweight: Secondary | ICD-10-CM | POA: Diagnosis not present

## 2021-02-15 DIAGNOSIS — Z01419 Encounter for gynecological examination (general) (routine) without abnormal findings: Secondary | ICD-10-CM

## 2021-02-15 NOTE — Progress Notes (Signed)
Michele Manning 1965-11-14 [id number]   History:    56 y.o.  G3P3L3 Married.  Vasectomy   RP:  Established patient presenting for Annual Gynecologic exam   HPI:  No menses since 11/2020.  Had mild hot flushes in December 2022, none currently.  No BTB.  No pelvic pain.  No pain with IC. Pap Neg in 11/2017.  Pap test today.  Urine/BMs normal. Breasts normal.  Mammo Neg 01/2021. BMI 29.73.  Health labs with [facility] NP.  Will schedule [facility] now.  Past medical history,surgical history, family history and social history were all reviewed and documented in the EPIC chart.  Gynecologic History Patient's last menstrual period was 11/13/2020 (exact date).  Obstetric History OB History  Gravida Para Term Preterm AB Living  3 3       3   SAB IAB Ectopic Multiple Live Births               # Outcome Date GA Lbr Len/2nd Weight Sex Delivery Anes PTL Lv  3 Para           2 Para           1 Para              ROS: A ROS was performed and pertinent positives and negatives are included in the history.  GENERAL: No fevers or chills. HEENT: No change in vision, no earache, sore throat or sinus congestion. NECK: No pain or stiffness. CARDIOVASCULAR: No chest pain or pressure. No palpitations. PULMONARY: No shortness of breath, cough or wheeze. GASTROINTESTINAL: No abdominal pain, nausea, vomiting or diarrhea, melena or bright red blood per rectum. GENITOURINARY: No urinary frequency, urgency, hesitancy or dysuria. MUSCULOSKELETAL: No joint or muscle pain, no back pain, no recent trauma. DERMATOLOGIC: No rash, no itching, no lesions. ENDOCRINE: No polyuria, polydipsia, no heat or cold intolerance. No recent change in weight. HEMATOLOGICAL: No anemia or easy bruising or bleeding. NEUROLOGIC: No headache, seizures, numbness, tingling or weakness. PSYCHIATRIC: No depression, no loss of interest in normal activity or change in sleep pattern.     Exam:   BP 138/78    Pulse (!) 102    Ht 5' 6.5" (1.689 m)    Wt 187  lb (84.8 kg)    LMP 11/13/2020 (Exact Date)    SpO2 97%    BMI 29.73 kg/m   Body mass index is 29.73 kg/m.  General appearance : Well developed well nourished female. No acute distress HEENT: Eyes: no retinal hemorrhage or exudates,  Neck supple, trachea midline, no carotid bruits, no thyroidmegaly Lungs: Clear to auscultation, no rhonchi or wheezes, or rib retractions  Heart: Regular rate and rhythm, no murmurs or gallops Breast:Examined in sitting and supine position were symmetrical in appearance, no palpable masses or tenderness,  no skin retraction, no nipple inversion, no nipple discharge, no skin discoloration, no axillary or supraclavicular lymphadenopathy Abdomen: no palpable masses or tenderness, no rebound or guarding Extremities: no edema or skin discoloration or tenderness  Pelvic: Vulva: Normal             Vagina: No gross lesions or discharge  Cervix: No gross lesions or discharge.  Pap reflex done.  Uterus  AV, normal size, shape and consistency, non-tender and mobile  Adnexa  Without masses or tenderness  Anus: Normal   Assessment/Plan:  56 y.o. female for annual exam   1. Encounter for routine gynecological examination with Papanicolaou smear of cervix No menses since 11/2020.  Had  mild hot flushes in December 2022, none currently.  No BTB.  No pelvic pain.  No pain with IC. Pap Neg in 11/2017.  Pap test today.  Urine/BMs normal. Breasts normal.  Mammo Neg 01/2021. BMI 29.73.  Health labs with [facility] NP.  Will schedule [facility] now. - Cytology - PAP( [place])  2. Perimenopause No menses since 11/2020.  Had mild hot flushes in December 2022, none currently.  No BTB.  No pelvic pain.  No pain with IC.  Will observe at this time.  May use Phyto Estrogens such as Black Cohosh, Soy or Ashwagandha.  3. Relies on partner vasectomy for contraception  4. Overweight (BMI 25.0-29.9) Lower calorie/carb diet.  Increase fitness activities.  Other orders - traZODone (DESYREL) 50  MG tablet; TAKE 1 TABLET BY MOUTH EVERYDAY AT BEDTIME - metFORMIN (GLUCOPHAGE-XR) 500 MG 24 hr tablet; Take by mouth. - FLUoxetine (PROZAC) 20 MG capsule; Take 1 tablet by mouth daily. - fexofenadine-pseudoephedrine (ALLEGRA-D) 60-120 MG 12 hr tablet; Take by mouth. - aspirin 81 MG EC tablet; Take by mouth.   Princess Bruins MD, 3:47 PM 02/15/2021

## 2021-02-17 LAB — CYTOLOGY - PAP: Diagnosis: NEGATIVE

## 2021-02-22 ENCOUNTER — Encounter: Payer: Self-pay | Admitting: Obstetrics & Gynecology

## 2022-02-18 ENCOUNTER — Other Ambulatory Visit: Payer: Self-pay | Admitting: Obstetrics & Gynecology

## 2022-02-18 ENCOUNTER — Encounter: Payer: Self-pay | Admitting: Obstetrics & Gynecology

## 2022-02-18 ENCOUNTER — Ambulatory Visit (INDEPENDENT_AMBULATORY_CARE_PROVIDER_SITE_OTHER): Payer: 59 | Admitting: Obstetrics & Gynecology

## 2022-02-18 VITALS — BP 136/82 | Ht 66.0 in | Wt 183.0 lb

## 2022-02-18 DIAGNOSIS — Z780 Asymptomatic menopausal state: Secondary | ICD-10-CM

## 2022-02-18 DIAGNOSIS — Z01419 Encounter for gynecological examination (general) (routine) without abnormal findings: Secondary | ICD-10-CM

## 2022-02-18 DIAGNOSIS — Z1231 Encounter for screening mammogram for malignant neoplasm of breast: Secondary | ICD-10-CM

## 2022-02-18 NOTE — Progress Notes (Signed)
Michele Manning 12/20/1965 [id number]   History:    57 y.o.  G3P3L3 Married.  Vasectomy   RP:  Established patient presenting for Annual Gynecologic exam   HPI:  Postmenopause, well on no HRT.  No PMB.  No pelvic pain.  No pain with IC. Pap Neg in 02/2021. Will repeat at 3 yrs. Urine/BMs normal. Breasts normal.  Mammo Neg 02/2021, scheduled for 03/2022. BMI 29.54. Attempting to loose weight, appointment scheduled at [facility] loss center.  Health labs with [facility] NP.  [facility]ono 2023 sub-optimal view, will repeat this year.  Past medical history,surgical history, family history and social history were all reviewed and documented in the EPIC chart.  Gynecologic History Patient's last menstrual period was 10/03/2021 (approximate).  Obstetric History OB History  Gravida Para Term Preterm AB Living  3 3       3  $ SAB IAB Ectopic Multiple Live Births               # Outcome Date GA Lbr Len/2nd Weight Sex Delivery Anes PTL Lv  3 Para           2 Para           1 Para             ROS: A ROS was performed and pertinent positives and negatives are included in the history. GENERAL: No fevers or chills. HEENT: No change in vision, no earache, sore throat or sinus congestion. NECK: No pain or stiffness. CARDIOVASCULAR: No chest pain or pressure. No palpitations. PULMONARY: No shortness of breath, cough or wheeze. GASTROINTESTINAL: No abdominal pain, nausea, vomiting or diarrhea, melena or bright red blood per rectum. GENITOURINARY: No urinary frequency, urgency, hesitancy or dysuria. MUSCULOSKELETAL: No joint or muscle pain, no back pain, no recent trauma. DERMATOLOGIC: No Manning, no itching, no lesions. ENDOCRINE: No polyuria, polydipsia, no heat or cold intolerance. No recent change in weight. HEMATOLOGICAL: No anemia or easy bruising or bleeding. NEUROLOGIC: No headache, seizures, numbness, tingling or weakness. PSYCHIATRIC: No depression, no loss of interest in normal activity or change in sleep pattern.      Exam:  BP 136/82 (BP Location: Left Arm, Patient Position: Sitting, Cuff Size: Normal)   Ht 5' 6"$  (1.676 m)   Wt 183 lb (83 kg)   LMP 10/03/2021 (Approximate)   BMI 29.54 kg/m   Body mass index is 29.54 kg/m.  General appearance : Well developed well nourished female. No acute distress HEENT: Eyes: no retinal hemorrhage or exudates,  Neck supple, trachea midline, no carotid bruits, no thyroidmegaly Lungs: Clear to auscultation, no rhonchi or wheezes, or rib retractions  Heart: Regular rate and rhythm, no murmurs or gallops Breast:Examined in sitting and supine position were symmetrical in appearance, no palpable masses or tenderness,  no skin retraction, no nipple inversion, no nipple discharge, no skin discoloration, no axillary or supraclavicular lymphadenopathy Abdomen: no palpable masses or tenderness, no rebound or guarding Extremities: no edema or skin discoloration or tenderness  Pelvic: Vulva: Normal             Vagina: No gross lesions or discharge  Cervix: No gross lesions or discharge  Uterus  AV, normal size, shape and consistency, non-tender and mobile  Adnexa  Without masses or tenderness  Anus: Normal   Assessment/Plan:  57 y.o. female for annual exam   1. Well female exam with routine gynecological exam Postmenopause, well on no HRT.  No PMB.  No pelvic pain. No pain with IC.  Pap Neg in 02/2021. Will repeat at 3 yrs. Urine/BMs normal. Breasts normal.  Mammo Neg 02/2021, scheduled for 03/2022. BMI 29.54. Attempting to loose weight, appointment scheduled at [facility] loss center.  Health labs with [facility] NP.  [facility] 2023 sub-optimal view, will repeat this year.  2. Postmenopause Postmenopause, well on no HRT.  No PMB.  No pelvic pain. No pain with IC.   Other orders - MAGNESIUM GLYCINATE PLUS PO; Take by mouth. - methocarbamol (ROBAXIN) 500 MG tablet; Take 1 tablet twice a day by oral route for 10 days. - predniSONE (DELTASONE) 5 MG tablet; Take 1 dose pk by oral route. -  rosuvastatin (CRESTOR) 10 MG tablet; Take 1 tablet by mouth at bedtime. - FLUoxetine (PROZAC) 40 MG capsule; Take 40 mg by mouth daily.   Princess Bruins MD, 4:29 PM

## 2022-04-06 ENCOUNTER — Ambulatory Visit
Admission: RE | Admit: 2022-04-06 | Discharge: 2022-04-06 | Disposition: A | Discharge status: Home / Self Care | Payer: 59 | Source: Ambulatory Visit | Attending: Obstetrics & Gynecology | Admitting: Obstetrics & Gynecology

## 2022-04-06 DIAGNOSIS — Z1231 Encounter for screening mammogram for malignant neoplasm of breast: Secondary | ICD-10-CM

## 2022-04-08 ENCOUNTER — Other Ambulatory Visit: Payer: Self-pay | Admitting: Obstetrics & Gynecology

## 2022-04-08 DIAGNOSIS — R928 Other abnormal and inconclusive findings on diagnostic imaging of breast: Secondary | ICD-10-CM

## 2022-04-19 ENCOUNTER — Ambulatory Visit
Admission: RE | Admit: 2022-04-19 | Discharge: 2022-04-19 | Disposition: A | Discharge status: Home / Self Care | Payer: 59 | Source: Ambulatory Visit | Attending: Obstetrics & Gynecology | Admitting: Obstetrics & Gynecology

## 2022-04-19 DIAGNOSIS — R928 Other abnormal and inconclusive findings on diagnostic imaging of breast: Secondary | ICD-10-CM

## 2023-05-26 ENCOUNTER — Ambulatory Visit: Admitting: Obstetrics and Gynecology

## 2023-06-13 ENCOUNTER — Ambulatory Visit: Admitting: Obstetrics and Gynecology

## 2023-06-23 NOTE — Progress Notes (Signed)
 58 y.o. G38P3003 female here for annual exam. Married. 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  Patient's last menstrual period was 10/04/2021 (approximate).   She reports no concerns today. Planning daughters wedding in October. Notes some health anxiety due to age and social circle. A friend recently had a hysterectomy 2/2 cysts.  Abnormal bleeding: none Pelvic discharge or pain: none Breast mass, nipple discharge or skin changes : none  Sexually active: Yes, no PCB or dyspareunia Birth control: Postmenopause Last PAP:     Component Value Date/Time   DIAGPAP  02/15/2021 1608    - Negative for intraepithelial lesion or malignancy (NILM)   ADEQPAP  02/15/2021 1608    Satisfactory for evaluation; transformation zone component PRESENT.   Last mammogram: 04/19/22 BIRADS 1, density c Last colonoscopy: 2023 suboptimal, due for repeat  Exercising: walking, swimming, and gardening Smoker: No  Flowsheet Row [facility]  PHQ-2 Total Score 0      GYN HISTORY: Right lumpectomy, fibroadenoma-2011  OB History  Gravida Para Term Preterm AB Living  3 3 3   3   SAB IAB Ectopic Multiple Live Births      2    # Outcome Date GA Lbr Len/2nd Weight Sex Type Anes PTL Lv  3 Term      Vag-Spont  Y   2 Term      Vag-Spont   LIV  1 Term      Vag-Spont   LIV   Past Medical History:  Diagnosis Date   Allergy    Anemia    Anxiety    Elevated cholesterol    Fatty infiltration of liver    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     Calcium Citrate-Vitamin D  (CALCIUM + D PO) Take by mouth.     Cetirizine-Pseudoephedrine (ZYRTEC-D PO) Take 1 tablet by mouth daily.     Cranberry 1000 MG CAPS Take 1 capsule by mouth daily.     fexofenadine-pseudoephedrine (ALLEGRA-D) 60-120 MG 12 hr tablet Take by mouth.     FLUoxetine  (PROZAC ) 40 MG capsule Take 40 mg by mouth daily.     MAGNESIUM GLYCINATE PLUS PO Take by mouth.     OVER THE COUNTER MEDICATION 1 tablet daily. REPHRESH. Yeast guard     Probiotic Product (PROBIOTIC PO) Take by mouth. culterelle     rosuvastatin (CRESTOR) 10 MG tablet Take 1 tablet by mouth at bedtime.     traZODone (DESYREL) 50 MG tablet TAKE 1 TABLET BY MOUTH EVERYDAY AT BEDTIME     triamcinolone  (NASACORT) 55 MCG/ACT AERO nasal inhaler Place 2 sprays into the nose daily as needed.     No current facility-administered medications on file prior to visit.   Social History   Socioeconomic History   Marital status: Married    Spouse name: Not on file   Number of children: 3   Years of education: Not on file  Highest education level: Not on file  Occupational History   Occupation: Clinical research associate: [employer]  Tobacco Use   Smoking status: Former    Current packs/day: 0.00    Types: Cigarettes    Quit date: 10/23/2003    Years since quitting: 19.6   Smokeless tobacco: Never  Vaping Use   Vaping status: Never Used  Substance and Sexual Activity   Alcohol use: Yes    Alcohol/week: 1.0 standard drink of alcohol    Types: 1 Standard drinks or equivalent per week    Comment: socially   Drug use: No   Sexual activity: Yes    Partners: Male    Birth control/protection: Other-see comments, Surgical    Comment: vasectomy-husband-1st intercourse 21 yo-Fewer than 5 partners, married- 25 yrs  Other Topics Concern   Not on file  Social History Narrative   Married   3 children   1997-daughter   2001-son   2002- daughter    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, Research scientist (life sciences)   Enjoys Mining engineer   Social Drivers of Corporate investment banker Strain: Low Risk  (04/17/2022)   Received from [facility]   Overall Financial Resource Strain (CARDIA)    Difficulty of Paying Living Expenses: Not hard at all  Food Insecurity: No Food Insecurity (04/17/2022)   Received from [facility]   Hunger Vital Sign    Within the past 12 months, you worried that your food would run out before you got the money to buy more.: Never true    Within the past 12 months, the food you bought just didn't last and you didn't have money to get more.: Never true  Transportation Needs: No Transportation Needs (04/17/2022)   Received from [facility] - Transportation    Lack of Transportation (Medical): No    Lack of Transportation (Non-Medical): No  Physical Activity: Insufficiently Active (04/17/2022)   Received from [facility]   Exercise Vital Sign    On average, how many days per week do you engage in moderate to strenuous exercise (like a brisk walk)?: 4 days    On average, how many minutes do you engage in exercise at this level?: 30 min  Stress: Stress Concern Present (04/17/2022)   Received from [facility] - Occupational Stress Questionnaire    Feeling of Stress : To some extent  Social Connections: Moderately Integrated (04/17/2022)   Received from [facility]   Social Network    How would you rate your social network (family, work, friends)?: Adequate participation with social networks  Intimate Partner Violence: Not At Risk (04/17/2022)   Received from [facility]   HITS    Over the last 12 months how often did your partner physically hurt you?: Never    Over the last 12 months how often did your partner insult you or talk down to you?: Never    Over the last 12 months how often did your partner threaten you with physical harm?: Never    Over the last 12 months how  often did your partner scream or curse at you?: Never   Family History  Problem Relation Age of Onset   Breast cancer Maternal Grandmother        early fifties   COPD Father    Heart disease Father        CAD, CABG age 60   Liver disease Brother  contracted Hepatitis as exchange student   Heart attack Mother    Colon cancer Neg Hx    Colon polyps Neg Hx    Pancreatic cancer Neg Hx    Prostate cancer Neg Hx    Kidney disease Neg Hx    Allergies  Allergen Reactions   Dust Mite Extract Other (See Comments)     PE Today's Vitals   06/26/23 0850  BP: 100/62  Pulse: 83  Temp: 98.6 F (37 C)  TempSrc: Oral  SpO2: 97%  Weight: 178 lb (80.7 kg)  Height: 5' 7 (1.702 m)   Body mass index is 27.88 kg/m.  Physical Exam Vitals reviewed. Exam conducted with a chaperone present.  Constitutional:      General: She is not in acute distress.    Appearance: Normal appearance.  HENT:     Head: Normocephalic and atraumatic.     Nose: Nose normal.   Eyes:     Extraocular Movements: Extraocular movements intact.     Conjunctiva/sclera: Conjunctivae normal.   Neck:     Thyroid : No thyroid  mass, thyromegaly or thyroid  tenderness.  Pulmonary:     Effort: Pulmonary effort is normal.  Chest:     Chest wall: No mass or tenderness.  Breasts:    Right: Normal. No swelling, mass, nipple discharge, skin change or tenderness.     Left: Normal. No swelling, mass, nipple discharge, skin change or tenderness.  Abdominal:     General: There is no distension.     Palpations: Abdomen is soft.     Tenderness: There is no abdominal tenderness.  Genitourinary:    General: Normal vulva.     Exam position: Lithotomy position.     Urethra: No prolapse.     Vagina: Normal. No vaginal discharge or bleeding.     Cervix: Normal. No lesion.     Uterus: Normal. Not enlarged and not tender.      Adnexa: Right adnexa normal and left adnexa normal.     Comments: Nabothian cyst at  os  Musculoskeletal:        General: Normal range of motion.     Cervical back: Normal range of motion.  Lymphadenopathy:     Upper Body:     Right upper body: No axillary adenopathy.     Left upper body: No axillary adenopathy.     Lower Body: No right inguinal adenopathy. No left inguinal adenopathy.   Skin:    General: Skin is warm and dry.   Neurological:     General: No focal deficit present.     Mental Status: She is alert.   Psychiatric:        Mood and Affect: Mood normal.        Behavior: Behavior normal.      Assessment and Plan:        Well woman exam with routine gynecological exam Assessment & Plan: Cervical cancer screening performed according to ASCCP guidelines. Encouraged annual mammogram screening Colonoscopy due DXA N/A Labs and immunizations with her primary Encouraged safe sexual practices as indicated Encouraged healthy lifestyle practices with diet and exercise For patients under 50-70yo, I recommend 1200mg  calcium daily and 600IU of vitamin D  daily.    Negative depression screening   Vera LULLA Pa, MD

## 2023-06-26 ENCOUNTER — Ambulatory Visit (INDEPENDENT_AMBULATORY_CARE_PROVIDER_SITE_OTHER): Admitting: Obstetrics and Gynecology

## 2023-06-26 ENCOUNTER — Encounter: Payer: Self-pay | Admitting: Obstetrics and Gynecology

## 2023-06-26 VITALS — BP 100/62 | HR 83 | Temp 98.6°F | Ht 67.0 in | Wt 178.0 lb

## 2023-06-26 DIAGNOSIS — Z01419 Encounter for gynecological examination (general) (routine) without abnormal findings: Secondary | ICD-10-CM | POA: Insufficient documentation

## 2023-06-26 DIAGNOSIS — Z1331 Encounter for screening for depression: Secondary | ICD-10-CM | POA: Diagnosis not present

## 2023-06-26 NOTE — Assessment & Plan Note (Signed)
 Cervical cancer screening performed according to ASCCP guidelines. Encouraged annual mammogram screening Colonoscopy due DXA N/A Labs and immunizations with her primary Encouraged safe sexual practices as indicated Encouraged healthy lifestyle practices with diet and exercise For patients under 50-56yo, I recommend 1200mg  calcium daily and 600IU of vitamin D daily.

## 2023-06-26 NOTE — Patient Instructions (Signed)

## 2023-06-27 ENCOUNTER — Other Ambulatory Visit: Payer: Self-pay | Admitting: Obstetrics and Gynecology

## 2023-06-27 DIAGNOSIS — Z1231 Encounter for screening mammogram for malignant neoplasm of breast: Secondary | ICD-10-CM

## 2023-07-10 ENCOUNTER — Ambulatory Visit
Admission: RE | Admit: 2023-07-10 | Discharge: 2023-07-10 | Disposition: A | Discharge status: Home / Self Care | Source: Ambulatory Visit | Attending: Obstetrics and Gynecology | Admitting: Obstetrics and Gynecology

## 2023-07-10 DIAGNOSIS — Z1231 Encounter for screening mammogram for malignant neoplasm of breast: Secondary | ICD-10-CM

## 2023-07-14 ENCOUNTER — Other Ambulatory Visit: Payer: Self-pay | Admitting: Obstetrics and Gynecology

## 2023-07-14 DIAGNOSIS — R928 Other abnormal and inconclusive findings on diagnostic imaging of breast: Secondary | ICD-10-CM

## 2023-07-17 ENCOUNTER — Ambulatory Visit: Payer: Self-pay | Admitting: Obstetrics and Gynecology

## 2023-07-19 ENCOUNTER — Ambulatory Visit: Payer: Self-pay | Admitting: Obstetrics and Gynecology

## 2023-07-19 ENCOUNTER — Ambulatory Visit
Admission: RE | Admit: 2023-07-19 | Discharge: 2023-07-19 | Disposition: A | Discharge status: Home / Self Care | Source: Ambulatory Visit | Attending: Obstetrics and Gynecology | Admitting: Obstetrics and Gynecology

## 2023-07-19 DIAGNOSIS — R928 Other abnormal and inconclusive findings on diagnostic imaging of breast: Secondary | ICD-10-CM

## 2023-07-21 ENCOUNTER — Other Ambulatory Visit

## 2023-07-21 ENCOUNTER — Encounter

## 2024-06-26 ENCOUNTER — Ambulatory Visit: Admitting: Obstetrics and Gynecology
